# Patient Record
Sex: Male | Born: 1980
Health system: Southern US, Community
[De-identification: ages and names within clinical notes are randomized; demographics above are authoritative.]

## PROBLEM LIST (undated history)

## (undated) DIAGNOSIS — M549 Dorsalgia, unspecified: Secondary | ICD-10-CM

## (undated) DIAGNOSIS — I10 Essential (primary) hypertension: Secondary | ICD-10-CM

## (undated) DIAGNOSIS — N200 Calculus of kidney: Secondary | ICD-10-CM

## (undated) DIAGNOSIS — G8929 Other chronic pain: Secondary | ICD-10-CM

## (undated) HISTORY — PX: OTHER SURGICAL HISTORY: SHX169

## (undated) HISTORY — PX: NO PAST SURGERIES: SHX2092

## (undated) HISTORY — DX: Essential (primary) hypertension: I10

---

## 2005-01-07 ENCOUNTER — Emergency Department (HOSPITAL_COMMUNITY): Admission: EM | Admit: 2005-01-07 | Discharge: 2005-01-07 | Payer: Self-pay | Admitting: Emergency Medicine

## 2009-05-26 ENCOUNTER — Emergency Department: Payer: Self-pay | Admitting: Emergency Medicine

## 2010-07-25 DIAGNOSIS — K219 Gastro-esophageal reflux disease without esophagitis: Secondary | ICD-10-CM | POA: Insufficient documentation

## 2010-12-11 ENCOUNTER — Ambulatory Visit: Payer: Self-pay | Admitting: Family Medicine

## 2011-04-10 ENCOUNTER — Encounter: Payer: Self-pay | Admitting: *Deleted

## 2011-04-10 ENCOUNTER — Encounter: Payer: Self-pay | Admitting: Family Medicine

## 2011-04-10 ENCOUNTER — Ambulatory Visit (INDEPENDENT_AMBULATORY_CARE_PROVIDER_SITE_OTHER): Payer: 59 | Admitting: Family Medicine

## 2011-04-10 DIAGNOSIS — R5381 Other malaise: Secondary | ICD-10-CM

## 2011-04-10 DIAGNOSIS — E1169 Type 2 diabetes mellitus with other specified complication: Secondary | ICD-10-CM | POA: Insufficient documentation

## 2011-04-10 DIAGNOSIS — I10 Essential (primary) hypertension: Secondary | ICD-10-CM | POA: Insufficient documentation

## 2011-04-10 DIAGNOSIS — Z136 Encounter for screening for cardiovascular disorders: Secondary | ICD-10-CM

## 2011-04-10 DIAGNOSIS — E119 Type 2 diabetes mellitus without complications: Secondary | ICD-10-CM | POA: Insufficient documentation

## 2011-04-10 DIAGNOSIS — E785 Hyperlipidemia, unspecified: Secondary | ICD-10-CM | POA: Insufficient documentation

## 2011-04-10 DIAGNOSIS — R5383 Other fatigue: Secondary | ICD-10-CM | POA: Insufficient documentation

## 2011-04-10 DIAGNOSIS — K130 Diseases of lips: Secondary | ICD-10-CM

## 2011-04-10 LAB — LIPID PANEL
Cholesterol: 258 mg/dL — ABNORMAL HIGH (ref 0–200)
Total CHOL/HDL Ratio: 5

## 2011-04-10 LAB — BASIC METABOLIC PANEL
BUN: 11 mg/dL (ref 6–23)
Creatinine, Ser: 0.7 mg/dL (ref 0.4–1.5)
GFR: 162.33 mL/min (ref >60.00)

## 2011-04-10 MED ORDER — LOSARTAN POTASSIUM-HCTZ 100-25 MG PO TABS: 1.0000 | ORAL_TABLET | Freq: Every day | ORAL | Status: DC

## 2011-04-10 MED ORDER — METFORMIN HCL 1000 MG PO TABS: 1000.0000 mg | ORAL_TABLET | Freq: Two times a day (BID) | ORAL | Status: DC

## 2011-04-10 MED ORDER — GEMFIBROZIL 600 MG PO TABS: 600.0000 mg | ORAL_TABLET | Freq: Two times a day (BID) | ORAL | Status: DC

## 2011-04-10 MED ORDER — METOPROLOL TARTRATE 50 MG PO TABS: 50.0000 mg | ORAL_TABLET | Freq: Two times a day (BID) | ORAL | Status: DC

## 2011-04-10 MED ORDER — INSULIN GLARGINE 100 UNIT/ML ~~LOC~~ SOLN: 75.0000 [IU] | Freq: Every day | SUBCUTANEOUS | Status: DC

## 2011-04-10 MED ORDER — CLOTRIMAZOLE 1 % EX CREA: TOPICAL_CREAM | Freq: Two times a day (BID) | CUTANEOUS | Status: DC

## 2011-04-10 MED ORDER — AMLODIPINE BESYLATE 10 MG PO TABS: 10.0000 mg | ORAL_TABLET | Freq: Every day | ORAL | Status: DC

## 2011-04-10 NOTE — Progress Notes (Signed)
Subjective:    Patient ID: Travis Scott, male    DOB: [birth year], 30 y.o.   MRN: [id number]  HPI  30 yo here to establish care.  DM- diagnosed in 2004. Currently taking Metformin 1000 mg twice daily and Lantus 75 units daily. Checks his CBGs three times weekly, usually ranging 110-170s. Denies any episodes of hypoglycemia. Cannot remember last time a1c was checked. Had dilated eye exam earlier this year.  HTN- diagnosed as a teenager. On Metoprolol 50 mg twice daily, losartan/hctz 100/25 mg daily, and amlodipine 10 mg daily. No CP, SOB or blurred vision.  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  Less energy to play with his two boys when he gets home. No blood in stool, SOB or CP.  Patient Active Problem List  Diagnoses  . Hypertension  . Diabetes mellitus  . Angular cheilitis  . Fatigue  . Hyperlipidemia   Past Medical History  Diagnosis Date  . Hypertension   . Diabetes mellitus    No past surgical history on file. History  Substance Use Topics  . Smoking status: Current Some Day Smoker  . Smokeless tobacco: Not on file  . Alcohol Use: Not on file   Family History  Problem Relation Age of Onset  . Hypertension Mother   . Diabetes Mother   . Diabetes Father   . Hypertension Father    No Known Allergies  Current outpatient prescriptions:amLODipine (NORVASC) 10 MG tablet, Take 1 tablet (10 mg total) by mouth daily., Disp: 30 tablet, Rfl: 11;  gemfibrozil (LOPID) 600 MG tablet, Take 1 tablet (600 mg total) by mouth 2 (two) times daily., Disp: 60 tablet, Rfl: 11;  insulin glargine (LANTUS) 100 UNIT/ML injection, Inject 75 Units into the skin at bedtime., Disp: 10 mL, Rfl: 11 losartan-hydrochlorothiazide (HYZAAR) 100-25 MG per tablet, Take 1 tablet by mouth daily., Disp: 30 tablet, Rfl: 11;  metFORMIN (GLUCOPHAGE) 1000 MG tablet, Take 1 tablet (1,000 mg total) by mouth 2 (two) times  daily., Disp: 60 tablet, Rfl: 11;  metoprolol (LOPRESSOR) 50 MG tablet, Take 1 tablet (50 mg total) by mouth 2 (two) times daily., Disp: 60 tablet, Rfl: 11 clotrimazole (LOTRIMIN) 1 % cream, Apply topically 2 (two) times daily., Disp: 30 g, Rfl: 0  The PMH, PSH, Social History, Family History, Medications, and allergies have been reviewed in [facility], and have been updated if relevant.   Review of Systems See HPI    Objective:   Physical Exam BP 130/90  Pulse 96  Temp(Src) 98.7 F (37.1 C) (Oral)  Ht 6\' 1"  (1.854 m)  Wt 262 lb 4 oz (118.956 kg)  BMI 34.60 kg/m2 General:  overweght male in NAD Eyes:  PERRL Ears:  External ear exam shows no significant lesions or deformities.  Otoscopic examination reveals clear canals, tympanic membranes are intact bilaterally without bulging, retraction, inflammation or discharge. Hearing is grossly normal bilaterally. Nose:  External nasal examination shows no deformity or inflammation. Nasal mucosa are pink and moist without lesions or exudates. Mouth:  +angular chielitis,  oropharynx without lesions or exudates.  Teeth in good repair. Neck:  no carotid bruit or thyromegaly no cervical or supraclavicular lymphadenopathy  Lungs:  Normal respiratory effort, chest expands symmetrically. Lungs are clear to auscultation, no crackles or wheezes. Heart:  Normal rate and regular rhythm. S1 and S2 normal without gallop, murmur, click, rub or other extra sounds. Abdomen:  Bowel sounds positive,abdomen  soft and non-tender without masses, organomegaly or hernias noted. Pulses:  R and L posterior tibial pulses are full and equal bilaterally  Extremities:  no edema      Assessment & Plan:   1. Angular cheilitis   New.  Handout given, rx sent for lotrimin cream.   2. Diabetes mellitus   Unchanged.  Refilled meds, recheck a1c, BMET today. HgB A1c  3. Hypertension   Stable on current meds. Basic Metabolic Panel (BMET)  4. Fatigue   New,  likey due to changing  shifts at work but will check labs to rule out other possible contributing factors. Vitamin D (25 hydroxy), B12  5. Hyperlipidemia   Recheck lipid panel today.

## 2011-04-10 NOTE — Patient Instructions (Signed)
Good to see you.  Try using antifungal cream.   We will be in touch with your lab work.

## 2011-04-11 ENCOUNTER — Other Ambulatory Visit: Payer: Self-pay | Admitting: Family Medicine

## 2011-04-11 ENCOUNTER — Emergency Department: Payer: Self-pay | Admitting: Emergency Medicine

## 2011-04-11 LAB — LDL CHOLESTEROL, DIRECT: Direct LDL: 191.3 mg/dL

## 2011-04-11 MED ORDER — ROSUVASTATIN CALCIUM 10 MG PO TABS: 10.0000 mg | ORAL_TABLET | Freq: Every day | ORAL | Status: DC

## 2011-04-11 MED ORDER — ERGOCALCIFEROL 1.25 MG (50000 UT) PO CAPS: 50000.0000 [IU] | ORAL_CAPSULE | ORAL | Status: DC

## 2011-04-11 MED ORDER — GLYBURIDE 5 MG PO TABS: 5.0000 mg | ORAL_TABLET | Freq: Every day | ORAL | Status: DC

## 2011-04-11 MED ORDER — INSULIN GLARGINE 100 UNIT/ML ~~LOC~~ SOLN: 80.0000 [IU] | Freq: Every day | SUBCUTANEOUS | Status: DC

## 2011-04-13 ENCOUNTER — Encounter: Payer: Self-pay | Admitting: *Deleted

## 2011-05-28 ENCOUNTER — Emergency Department: Payer: Self-pay | Admitting: Emergency Medicine

## 2011-07-22 ENCOUNTER — Other Ambulatory Visit: Payer: Self-pay | Admitting: Family Medicine

## 2011-11-23 ENCOUNTER — Ambulatory Visit: Payer: Self-pay | Admitting: Emergency Medicine

## 2011-11-24 ENCOUNTER — Ambulatory Visit: Payer: Self-pay

## 2011-11-25 ENCOUNTER — Ambulatory Visit: Payer: Self-pay | Admitting: Internal Medicine

## 2011-11-26 ENCOUNTER — Ambulatory Visit: Payer: Self-pay | Admitting: Emergency Medicine

## 2011-11-27 LAB — WOUND CULTURE

## 2011-11-28 ENCOUNTER — Ambulatory Visit: Payer: Self-pay | Admitting: Internal Medicine

## 2011-12-03 ENCOUNTER — Ambulatory Visit: Payer: Self-pay

## 2011-12-06 ENCOUNTER — Ambulatory Visit (INDEPENDENT_AMBULATORY_CARE_PROVIDER_SITE_OTHER): Payer: BC Managed Care – PPO | Admitting: Family Medicine

## 2011-12-06 ENCOUNTER — Encounter: Payer: Self-pay | Admitting: Family Medicine

## 2011-12-06 ENCOUNTER — Other Ambulatory Visit: Payer: Self-pay | Admitting: Family Medicine

## 2011-12-06 VITALS — BP 140/80 | HR 76 | Temp 98.0°F | Ht 72.0 in | Wt 260.0 lb

## 2011-12-06 DIAGNOSIS — E119 Type 2 diabetes mellitus without complications: Secondary | ICD-10-CM | POA: Insufficient documentation

## 2011-12-06 DIAGNOSIS — E785 Hyperlipidemia, unspecified: Secondary | ICD-10-CM

## 2011-12-06 DIAGNOSIS — R5381 Other malaise: Secondary | ICD-10-CM

## 2011-12-06 DIAGNOSIS — E1149 Type 2 diabetes mellitus with other diabetic neurological complication: Secondary | ICD-10-CM | POA: Insufficient documentation

## 2011-12-06 DIAGNOSIS — E1165 Type 2 diabetes mellitus with hyperglycemia: Secondary | ICD-10-CM

## 2011-12-06 DIAGNOSIS — I10 Essential (primary) hypertension: Secondary | ICD-10-CM

## 2011-12-06 DIAGNOSIS — R5383 Other fatigue: Secondary | ICD-10-CM

## 2011-12-06 LAB — COMPREHENSIVE METABOLIC PANEL
ALT: 15 U/L (ref 0–53)
AST: 16 U/L (ref 0–37)
Calcium: 9.6 mg/dL (ref 8.4–10.5)
Chloride: 97 mEq/L (ref 96–112)
Creatinine, Ser: 0.7 mg/dL (ref 0.4–1.5)
Sodium: 132 mEq/L — ABNORMAL LOW (ref 135–145)

## 2011-12-06 LAB — LIPID PANEL
HDL: 39.6 mg/dL (ref >39.00)
Total CHOL/HDL Ratio: 5
VLDL: 19.4 mg/dL (ref 0.0–40.0)

## 2011-12-06 LAB — HEMOGLOBIN A1C: Hgb A1c MFr Bld: 15.5 % — ABNORMAL HIGH (ref 4.6–6.5)

## 2011-12-06 MED ORDER — ROSUVASTATIN CALCIUM 10 MG PO TABS: 10.0000 mg | ORAL_TABLET | Freq: Every day | ORAL | Status: AC

## 2011-12-06 NOTE — Patient Instructions (Signed)
Great to see you. We will call you with your lab results. I will hold onto your form until we get your labs back.

## 2011-12-06 NOTE — Progress Notes (Signed)
Subjective:    Patient ID: Travis Scott, male    DOB: 10/17/1980, 31 y.o.   MRN: [id number]  HPI  31 yo here to have forms filled out for work.  Established care here in 03/2011.   DM- diagnosed in 2004. a1c was extremely elevated and was advised to increase lantus and follow up in 3 months. Lab Results  Component Value Date   HGBA1C 14.8* 04/10/2011  Pt did not follow up and continued his previous dose of medication because he had not been taking it for weeks prior to labs being checked.  Currently taking Metformin 1000 mg twice daily and glyburide 5 mg daily. Has been out of lantus for one month due to cost. Checks his CBGs three times weekly, usually ranging 170s-200s.. Denies any episodes of hypoglycemia.  HLD- cholesterol very elevated as well.  I sent in rx for crestor but pt refused to take it- preferred to try diet only. Lab Results  Component Value Date   CHOL 258* 04/10/2011   HDL 55.90 04/10/2011   LDLDIRECT 191.3 04/10/2011   TRIG 122.0 04/10/2011   CHOLHDL 5 04/10/2011    HTN- diagnosed as a teenager. On Metoprolol 50 mg twice daily, losartan/hctz 100/25 mg daily, and amlodipine 10 mg daily. No CP, SOB or blurred vision. Lab Results  Component Value Date   CREATININE 0.7 04/10/2011   Low Vit D- Diagnosed in 03/2011, Vit D 23- s/p high dose replacement.   Patient Active Problem List  Diagnoses  . Hypertension  . Diabetes mellitus  . Angular cheilitis  . Fatigue  . Hyperlipidemia   Past Medical History  Diagnosis Date  . Hypertension   . Diabetes mellitus    No past surgical history on file. History  Substance Use Topics  . Smoking status: Current Some Day Smoker  . Smokeless tobacco: Not on file  . Alcohol Use: Not on file   Family History  Problem Relation Age of Onset  . Hypertension Mother   . Diabetes Mother   . Diabetes Father   . Hypertension Father    No Known Allergies  Current outpatient prescriptions:amLODipine (NORVASC) 10 MG  tablet, Take 1 tablet (10 mg total) by mouth daily., Disp: 30 tablet, Rfl: 11;  clotrimazole (LOTRIMIN) 1 % cream, Apply topically 2 (two) times daily., Disp: 30 g, Rfl: 0;  ergocalciferol (VITAMIN D2) 50000 UNITS capsule, Take 1 capsule (50,000 Units total) by mouth once a week., Disp: 4 capsule, Rfl: 12 gemfibrozil (LOPID) 600 MG tablet, Take 1 tablet (600 mg total) by mouth 2 (two) times daily., Disp: 60 tablet, Rfl: 11;  glyBURIDE (DIABETA) 5 MG tablet, TAKE 1 TABLET BY MOUTH EVERY DAY WITH BREAKFAST, Disp: 30 tablet, Rfl: 12;  insulin glargine (LANTUS) 100 UNIT/ML injection, Inject 80 Units into the skin at bedtime., Disp: 10 mL, Rfl: 11 losartan-hydrochlorothiazide (HYZAAR) 100-25 MG per tablet, Take 1 tablet by mouth daily., Disp: 30 tablet, Rfl: 11;  metFORMIN (GLUCOPHAGE) 1000 MG tablet, Take 1 tablet (1,000 mg total) by mouth 2 (two) times daily., Disp: 60 tablet, Rfl: 11;  metoprolol (LOPRESSOR) 50 MG tablet, Take 1 tablet (50 mg total) by mouth 2 (two) times daily., Disp: 60 tablet, Rfl: 11 rosuvastatin (CRESTOR) 10 MG tablet, Take 1 tablet (10 mg total) by mouth at bedtime., Disp: 30 tablet, Rfl: 11  The PMH, PSH, Social History, Family History, Medications, and allergies have been reviewed in [facility], and have been updated if relevant.   Review of Systems See HPI  Objective:   Physical Exam BP 140/80  Pulse 76  Temp 98 F (36.7 C)  Ht 6' (1.829 m)  Wt 260 lb (117.935 kg)  BMI 35.26 kg/m2 General:  overweght male in NAD Eyes:  PERRL Ears:  External ear exam shows no significant lesions or deformities.  Otoscopic examination reveals clear canals, tympanic membranes are intact bilaterally without bulging, retraction, inflammation or discharge. Hearing is grossly normal bilaterally. Nose:  External nasal examination shows no deformity or inflammation. Nasal mucosa are pink and moist without lesions or exudates. Neck:  no carotid bruit or thyromegaly no cervical or supraclavicular  lymphadenopathy  Lungs:  Normal respiratory effort, chest expands symmetrically. Lungs are clear to auscultation, no crackles or wheezes. Heart:  Normal rate and regular rhythm. S1 and S2 normal without gallop, murmur, click, rub or other extra sounds. Abdomen:  Bowel sounds positive,abdomen soft and non-tender without masses, organomegaly or hernias noted. Pulses:  R and L posterior tibial pulses are full and equal bilaterally  Extremities:  no edema      Assessment & Plan:   1. Work forms  Will return to patient once we get lab results from today.   2. Diabetes mellitus   Remains poorly controlled.  We do not have lantus samples and he did not want to switch to short acting.  Byetta would be expensive as well. Pt has a high deductible.  Refused endocrinology referral.  Advised picking up one vial and as soon as we get samples, I will call him. HgB A1c  3. Hypertension   Stable on current meds. Basic Metabolic Panel (BMET)  4. Hyperlipidemia   Recheck lipid panel today. He is willing to start crestor now, given samples.

## 2011-12-07 ENCOUNTER — Ambulatory Visit: Payer: 59 | Admitting: Family Medicine

## 2012-01-23 ENCOUNTER — Encounter: Payer: Self-pay | Admitting: *Deleted

## 2012-03-26 ENCOUNTER — Encounter: Payer: Self-pay | Admitting: Family Medicine

## 2012-03-26 ENCOUNTER — Ambulatory Visit (INDEPENDENT_AMBULATORY_CARE_PROVIDER_SITE_OTHER): Payer: BC Managed Care – PPO | Admitting: Family Medicine

## 2012-03-26 VITALS — BP 132/80 | HR 72 | Temp 98.1°F | Wt 255.0 lb

## 2012-03-26 DIAGNOSIS — E119 Type 2 diabetes mellitus without complications: Secondary | ICD-10-CM

## 2012-03-26 DIAGNOSIS — I10 Essential (primary) hypertension: Secondary | ICD-10-CM

## 2012-03-26 DIAGNOSIS — M549 Dorsalgia, unspecified: Secondary | ICD-10-CM

## 2012-03-26 MED ORDER — TRAMADOL HCL 50 MG PO TABS: 50.0000 mg | ORAL_TABLET | Freq: Three times a day (TID) | ORAL | Status: AC | PRN

## 2012-03-26 MED ORDER — DIAZEPAM 5 MG PO TABS: ORAL_TABLET | ORAL | Status: DC

## 2012-03-26 NOTE — Progress Notes (Signed)
Subjective:    Patient ID: Travis Scott, male    DOB: 1980-12-08, 31 y.o.   MRN: [id number]  HPI  31 yo here for follow up and back pain.  Back pain- lifted something at work and thinks he reactivated an old injury that happened while he was picking up his son last year.  Bilateral low back pain worsened with bending and walking. No LE weakness or radiculopathy.   DM- diagnosed in 2004.  Referred him to endocrinology- appt later this month.  Lab Results  Component Value Date   HGBA1C 15.5* 12/06/2011   Currently taking Metformin 1000 mg twice daily and glyburide 5 mg daily. Just restarted his lantus.  Checks his CBGs three times weekly, usually ranging 170s-200s.. Denies any episodes of hypoglycemia.    HTN- diagnosed as a teenager. On Metoprolol 50 mg twice daily, losartan/hctz 100/25 mg daily, and amlodipine 10 mg daily. No CP, SOB or blurred vision. Lab Results  Component Value Date   CREATININE 0.7 12/06/2011      Patient Active Problem List  Diagnosis  . Hypertension  . Diabetes mellitus  . Angular cheilitis  . Fatigue  . Hyperlipidemia  . Diabetes mellitus out of control   Past Medical History  Diagnosis Date  . Hypertension   . Diabetes mellitus    No past surgical history on file. History  Substance Use Topics  . Smoking status: Current Some Day Smoker  . Smokeless tobacco: Not on file  . Alcohol Use: Not on file   Family History  Problem Relation Age of Onset  . Hypertension Mother   . Diabetes Mother   . Diabetes Father   . Hypertension Father    No Known Allergies  Current outpatient prescriptions:amLODipine (NORVASC) 10 MG tablet, Take 1 tablet (10 mg total) by mouth daily., Disp: 30 tablet, Rfl: 11;  ergocalciferol (VITAMIN D2) 50000 UNITS capsule, Take 1 capsule (50,000 Units total) by mouth once a week., Disp: 4 capsule, Rfl: 12;  gemfibrozil (LOPID) 600 MG tablet, Take 1 tablet (600 mg total) by mouth 2 (two) times daily., Disp: 60  tablet, Rfl: 11 glyBURIDE (DIABETA) 5 MG tablet, TAKE 1 TABLET BY MOUTH EVERY DAY WITH BREAKFAST, Disp: 30 tablet, Rfl: 12;  insulin glargine (LANTUS) 100 UNIT/ML injection, Inject 80 Units into the skin at bedtime., Disp: 10 mL, Rfl: 11;  losartan-hydrochlorothiazide (HYZAAR) 100-25 MG per tablet, Take 1 tablet by mouth daily., Disp: 30 tablet, Rfl: 11 metFORMIN (GLUCOPHAGE) 1000 MG tablet, Take 1 tablet (1,000 mg total) by mouth 2 (two) times daily., Disp: 60 tablet, Rfl: 11;  metoprolol (LOPRESSOR) 50 MG tablet, Take 1 tablet (50 mg total) by mouth 2 (two) times daily., Disp: 60 tablet, Rfl: 11;  rosuvastatin (CRESTOR) 10 MG tablet, Take 1 tablet (10 mg total) by mouth at bedtime., Disp: 30 tablet, Rfl: 11  The PMH, PSH, Social History, Family History, Medications, and allergies have been reviewed in [facility], and have been updated if relevant.   Review of Systems See HPI    Objective:   Physical Exam BP 132/80  Pulse 72  Temp 98.1 F (36.7 C)  Wt 255 lb (115.667 kg) General:  overweght male in NAD Eyes:  PERRL Ears:  External ear exam shows no significant lesions or deformities.  Otoscopic examination reveals clear canals, tympanic membranes are intact bilaterally without bulging, retraction, inflammation or discharge. Hearing is grossly normal bilaterally. Nose:  External nasal examination shows no deformity or inflammation. Nasal mucosa are pink and moist without lesions  or exudates. Neck:  no carotid bruit or thyromegaly no cervical or supraclavicular lymphadenopathy  Lungs:  Normal respiratory effort, chest expands symmetrically. Lungs are clear to auscultation, no crackles or wheezes. Heart:  Normal rate and regular rhythm. S1 and S2 normal without gallop, murmur, click, rub or other extra sounds. Abdomen:  Bowel sounds positive,abdomen soft and non-tender without masses, organomegaly or hernias noted. Pulses:  R and L posterior tibial pulses are full and equal bilaterally  Extremities:   no edema  MSK: Lumbosacral spine area reveals no local tenderness or mass. Painful and reduced LS ROM noted. Straight leg raise is negative bilaterally. DTR's, motor strength and sensation normal, including heel and toe gait.  Peripheral pulses are palpable. Lumbar spine X-Ray: not indicated.          Assessment & Plan:   1. Low back pain  Lumbar strain. Valium, tramadol as needed (see pt instructions). Given handout from sports med advisor for exercises. Consider imaging and PT if symptoms persist.   2. Diabetes mellitus   Remains poorly controlled.   Given lantus samples today. Keep appt with endo.   3. Hypertension   Stable on current meds. Basic Metabolic Panel (BMET)

## 2012-03-26 NOTE — Patient Instructions (Signed)
You have lumbar radiculopathy (a pinched nerve in your low back). Take tylenol for baseline pain relief (1-2 extra strength tabs 3x/day) Tramadol as needed for severe pain.   Valium as needed for muscle spasms (no driving on this medicine). Stay as active as possible. Physical therapy has been shown to be helpful as well. Strengthening of low back muscles, abdominal musculature are key for long term pain relief. If not improving, will consider imaging.

## 2012-04-19 ENCOUNTER — Other Ambulatory Visit: Payer: Self-pay | Admitting: Family Medicine

## 2012-04-21 ENCOUNTER — Other Ambulatory Visit: Payer: Self-pay | Admitting: *Deleted

## 2012-04-21 MED ORDER — LOSARTAN POTASSIUM-HCTZ 100-25 MG PO TABS: 1.0000 | ORAL_TABLET | Freq: Every day | ORAL | Status: DC

## 2012-04-21 NOTE — Telephone Encounter (Signed)
Refill request for  Vitamin D, is patient to continue this?

## 2012-04-21 NOTE — Telephone Encounter (Signed)
Yes please continue but do need to recheck his Vit D at his convenience.

## 2012-04-22 ENCOUNTER — Other Ambulatory Visit: Payer: Self-pay | Admitting: *Deleted

## 2012-04-22 MED ORDER — INSULIN GLARGINE 100 UNIT/ML ~~LOC~~ SOLN: 80.0000 [IU] | Freq: Every day | SUBCUTANEOUS | Status: DC

## 2012-04-22 NOTE — Telephone Encounter (Signed)
Pt's phone number is no longer in service.  Mailed letter to his home address asking that he call us back.

## 2012-04-25 ENCOUNTER — Ambulatory Visit (INDEPENDENT_AMBULATORY_CARE_PROVIDER_SITE_OTHER): Payer: BC Managed Care – PPO | Admitting: Family Medicine

## 2012-04-25 ENCOUNTER — Encounter: Payer: Self-pay | Admitting: Family Medicine

## 2012-04-25 VITALS — BP 120/72 | HR 80 | Temp 98.0°F | Wt 265.0 lb

## 2012-04-25 DIAGNOSIS — R1032 Left lower quadrant pain: Secondary | ICD-10-CM

## 2012-04-25 LAB — POCT URINALYSIS DIPSTICK
Ketones, UA: NEGATIVE
Leukocytes, UA: NEGATIVE
Nitrite, UA: NEGATIVE
Protein, UA: NEGATIVE

## 2012-04-25 NOTE — Patient Instructions (Addendum)
Good to see you. Please stop by to see Travis Scott on your way out to set up your ultrasound. We will call you with those results.    

## 2012-04-25 NOTE — Progress Notes (Signed)
Subjective:    Patient ID: Travis Scott, male    DOB: May 31, 1981, 31 y.o.   MRN: [id number]  HPI  31 yo with h/o poorly controlled diabetes, non compliance here to have forms filled out and for groin pain.  We referred him to endocrinology and per pt, his CBGs have been improving.  Was 81 fasting this morning.   Last two days, has had left groin pain.  Does have a h/o kidney stones but this pain feels different.  No dysuria, hematuria or flank pain. Did not lift anything unusually heavy.  Pain is not worsened by coughing or having a BM. Points to lateral to left scrotum.    Patient Active Problem List  Diagnosis  . Hypertension  . Diabetes mellitus  . Angular cheilitis  . Fatigue  . Hyperlipidemia  . Diabetes mellitus out of control  . Back pain  . Left groin pain   Past Medical History  Diagnosis Date  . Hypertension   . Diabetes mellitus    No past surgical history on file. History  Substance Use Topics  . Smoking status: Current Some Day Smoker  . Smokeless tobacco: Not on file  . Alcohol Use: Not on file   Family History  Problem Relation Age of Onset  . Hypertension Mother   . Diabetes Mother   . Diabetes Father   . Hypertension Father    No Known Allergies  Current outpatient prescriptions:amLODipine (NORVASC) 10 MG tablet, Take 1 tablet (10 mg total) by mouth daily., Disp: 30 tablet, Rfl: 11;  diazepam (VALIUM) 5 MG tablet, 1- 2 tabs by mouth as needed for muscle spasm; then may repeat 1-2 tabs by mouth in 3-4 hours, if necessary., Disp: 30 tablet, Rfl: 1;  gemfibrozil (LOPID) 600 MG tablet, Take 1 tablet (600 mg total) by mouth 2 (two) times daily., Disp: 60 tablet, Rfl: 11 glyBURIDE (DIABETA) 5 MG tablet, TAKE 1 TABLET BY MOUTH EVERY DAY WITH BREAKFAST, Disp: 30 tablet, Rfl: 12;  insulin glargine (LANTUS) 100 UNIT/ML injection, Inject 80 Units into the skin at bedtime., Disp: 10 mL, Rfl: 11;  losartan-hydrochlorothiazide (HYZAAR) 100-25 MG per tablet,  Take 1 tablet by mouth daily., Disp: 30 tablet, Rfl: 6;  metFORMIN (GLUCOPHAGE) 1000 MG tablet, TAKE 1 TABLET BY MOUTH TWICE DAILY, Disp: 60 tablet, Rfl: 6 metoprolol (LOPRESSOR) 50 MG tablet, Take 1 tablet (50 mg total) by mouth 2 (two) times daily., Disp: 60 tablet, Rfl: 11;  rosuvastatin (CRESTOR) 10 MG tablet, Take 1 tablet (10 mg total) by mouth at bedtime., Disp: 30 tablet, Rfl: 11;  Vitamin D, Ergocalciferol, (DRISDOL) 50000 UNITS CAPS, TAKE 1 CAPSULE BY MOUTH EACH WEEK., Disp: 4 capsule, Rfl: 0  The PMH, PSH, Social History, Family History, Medications, and allergies have been reviewed in [facility], and have been updated if relevant.   Review of Systems See HPI    Objective:   Physical Exam BP 120/72  Pulse 80  Temp 98 F (36.7 C)  Wt 265 lb (120.203 kg)  General:  overweght male in NAD Eyes:  PERRL Ears:  External ear exam shows no significant lesions or deformities.  Otoscopic examination reveals clear canals, tympanic membranes are intact bilaterally without bulging, retraction, inflammation or discharge. Hearing is grossly normal bilaterally. Nose:  External nasal examination shows no deformity or inflammation. Nasal mucosa are pink and moist without lesions or exudates. Mouth:  Oral mucosa and oropharynx without lesions or exudates.  Teeth in good repair. Neck:  no carotid bruit or thyromegaly no  cervical or supraclavicular lymphadenopathy  Lungs:  Normal respiratory effort, chest expands symmetrically. Lungs are clear to auscultation, no crackles or wheezes. Heart:  Normal rate and regular rhythm. S1 and S2 normal without gallop, murmur, click, rub or other extra sounds. Abdomen:  Bowel sounds positive,abdomen soft and non-tender without masses, organomegaly or hernias noted. Genitalia:  Testes bilaterally descended without nodularity, tenderness or masses. No scrotal masses or lesions. No penis lesions or urethral discharge. Prostate:  Prostate gland firm and smooth, 1 plus  enlargement, no nodularity, tenderness, mass, asymmetry or induration. Pulses:  R and L posterior tibial pulses are full and equal bilaterally  Extremities:  no edema         Assessment & Plan:   1. Left groin pain  POCT urinalysis dipstick, US Scrotum   New- unable to palpate any hernia on exam. ?possible hernia that I cannot appreciate. UA neg- no RBCs, unlikely nephrolithiasis. Will refer for scrotal US. The patient indicates understanding of these issues and agrees with the plan.

## 2012-04-30 ENCOUNTER — Encounter: Payer: Self-pay | Admitting: Family Medicine

## 2012-04-30 ENCOUNTER — Ambulatory Visit: Payer: Self-pay | Admitting: Family Medicine

## 2012-05-01 ENCOUNTER — Other Ambulatory Visit: Payer: Self-pay | Admitting: Family Medicine

## 2012-05-01 DIAGNOSIS — R1030 Lower abdominal pain, unspecified: Secondary | ICD-10-CM

## 2012-05-01 DIAGNOSIS — N433 Hydrocele, unspecified: Secondary | ICD-10-CM

## 2012-05-20 ENCOUNTER — Other Ambulatory Visit: Payer: Self-pay | Admitting: Family Medicine

## 2012-07-07 ENCOUNTER — Other Ambulatory Visit: Payer: Self-pay | Admitting: *Deleted

## 2012-07-07 MED ORDER — AMLODIPINE BESYLATE 10 MG PO TABS: 10.0000 mg | ORAL_TABLET | Freq: Every day | ORAL | Status: DC

## 2012-07-17 ENCOUNTER — Ambulatory Visit (INDEPENDENT_AMBULATORY_CARE_PROVIDER_SITE_OTHER)
Admission: RE | Admit: 2012-07-17 | Discharge: 2012-07-17 | Disposition: A | Discharge status: Home / Self Care | Payer: BC Managed Care – PPO | Source: Ambulatory Visit | Attending: Family Medicine | Admitting: Family Medicine

## 2012-07-17 ENCOUNTER — Ambulatory Visit (INDEPENDENT_AMBULATORY_CARE_PROVIDER_SITE_OTHER): Payer: BC Managed Care – PPO | Admitting: Family Medicine

## 2012-07-17 ENCOUNTER — Encounter: Payer: Self-pay | Admitting: Family Medicine

## 2012-07-17 VITALS — BP 150/98 | HR 72 | Temp 99.1°F | Wt 273.0 lb

## 2012-07-17 DIAGNOSIS — M79605 Pain in left leg: Secondary | ICD-10-CM

## 2012-07-17 DIAGNOSIS — M545 Low back pain, unspecified: Secondary | ICD-10-CM

## 2012-07-17 MED ORDER — AMITRIPTYLINE HCL 25 MG PO TABS: 25.0000 mg | ORAL_TABLET | Freq: Every day | ORAL | Status: DC

## 2012-07-17 NOTE — Progress Notes (Signed)
Subjective:    Patient ID: Travis Scott, male    DOB: 1980/11/21, 32 y.o.   MRN: [id number]  HPI  32 yo with h/o poorly controlled diabetes here with persistent low back pain and groin pain.  We referred him to urology.  Notes reviewed- he does have low testosterone, but otherwise did not feel it was a urological issue.  Did see some lumbar disc disease on KUB.  Back pain in low back, can radiate to both legs and still having burning in bilateral inguinal area.  Flexeril did not help and he felt caused mood swings.  Just started androgel for low T.   Patient Active Problem List  Diagnosis  . Hypertension  . Diabetes mellitus  . Angular cheilitis  . Fatigue  . Hyperlipidemia  . Diabetes mellitus out of control  . Back pain  . Left groin pain   Past Medical History  Diagnosis Date  . Hypertension   . Diabetes mellitus    No past surgical history on file. History  Substance Use Topics  . Smoking status: Current Some Day Smoker  . Smokeless tobacco: Not on file  . Alcohol Use: Not on file   Family History  Problem Relation Age of Onset  . Hypertension Mother   . Diabetes Mother   . Diabetes Father   . Hypertension Father    No Known Allergies  Current outpatient prescriptions:amLODipine (NORVASC) 10 MG tablet, Take 1 tablet (10 mg total) by mouth daily., Disp: 30 tablet, Rfl: 5;  diazepam (VALIUM) 5 MG tablet, 1- 2 tabs by mouth as needed for muscle spasm; then may repeat 1-2 tabs by mouth in 3-4 hours, if necessary., Disp: 30 tablet, Rfl: 1;  gemfibrozil (LOPID) 600 MG tablet, Take 1 tablet (600 mg total) by mouth 2 (two) times daily., Disp: 60 tablet, Rfl: 11 glyBURIDE (DIABETA) 5 MG tablet, TAKE 1 TABLET BY MOUTH EVERY DAY WITH BREAKFAST, Disp: 30 tablet, Rfl: 12;  insulin glargine (LANTUS) 100 UNIT/ML injection, Inject 80 Units into the skin at bedtime., Disp: 10 mL, Rfl: 11;  losartan-hydrochlorothiazide (HYZAAR) 100-25 MG per tablet, Take 1 tablet by mouth  daily., Disp: 30 tablet, Rfl: 6;  metFORMIN (GLUCOPHAGE) 1000 MG tablet, TAKE 1 TABLET BY MOUTH TWICE DAILY, Disp: 60 tablet, Rfl: 6 metoprolol (LOPRESSOR) 50 MG tablet, TAKE 1 TABLET BY MOUTH TWICE DAILY, Disp: 60 tablet, Rfl: 5;  rosuvastatin (CRESTOR) 10 MG tablet, Take 1 tablet (10 mg total) by mouth at bedtime., Disp: 30 tablet, Rfl: 11;  Vitamin D, Ergocalciferol, (DRISDOL) 50000 UNITS CAPS, TAKE 1 CAPSULE BY MOUTH EACH WEEK., Disp: 4 capsule, Rfl: 0;  amitriptyline (ELAVIL) 25 MG tablet, Take 1 tablet (25 mg total) by mouth at bedtime., Disp: 30 tablet, Rfl: 0  The PMH, PSH, Social History, Family History, Medications, and allergies have been reviewed in [facility], and have been updated if relevant.   Review of Systems See HPI    Still having impotence Objective:   Physical Exam BP 150/98  Pulse 72  Temp 99.1 F (37.3 C)  Wt 273 lb (123.832 kg)  General:  overweght male in NAD Eyes:  PERRL Ears:  External ear exam shows no significant lesions or deformities.  Otoscopic examination reveals clear canals, tympanic membranes are intact bilaterally without bulging, retraction, inflammation or discharge. Hearing is grossly normal bilaterally. Nose:  External nasal examination shows no deformity or inflammation. Nasal mucosa are pink and moist without lesions or exudates. Mouth:  Oral mucosa and oropharynx without lesions or exudates.  Teeth in good repair. Neck:  no carotid bruit or thyromegaly no cervical or supraclavicular lymphadenopathy  Pulses:  R and L posterior tibial pulses are full and equal bilaterally  Extremities:  no edema  No tenderness over lumbar spine.  SLR mildly positive bilaterally. Neg faber     Assessment & Plan:   1. Low back pain radiating to both legs  DG Lumbar Spine Complete, MR Lumbar Spine Wo Contrast   Persistent- ? If inguinal neuropathy is coming from his lumbar spine.  Will start Elavil 25 mg qhs.  Order lumbar xray and proceed with lumbar MRI for further  evaluation. The patient indicates understanding of these issues and agrees with the plan.

## 2012-07-17 NOTE — Patient Instructions (Addendum)
Good to see you. We will call you xray results.  Stop by to see Shirlee Limerick on your way out, she will set up your MRI.

## 2012-07-22 ENCOUNTER — Ambulatory Visit
Admission: RE | Admit: 2012-07-22 | Discharge: 2012-07-22 | Disposition: A | Discharge status: Home / Self Care | Payer: BC Managed Care – PPO | Source: Ambulatory Visit | Attending: Family Medicine | Admitting: Family Medicine

## 2012-07-22 DIAGNOSIS — M545 Low back pain: Secondary | ICD-10-CM

## 2012-07-22 DIAGNOSIS — M79605 Pain in left leg: Secondary | ICD-10-CM

## 2012-07-23 ENCOUNTER — Ambulatory Visit (INDEPENDENT_AMBULATORY_CARE_PROVIDER_SITE_OTHER): Payer: BC Managed Care – PPO | Admitting: Family Medicine

## 2012-07-23 ENCOUNTER — Encounter: Payer: Self-pay | Admitting: Family Medicine

## 2012-07-23 VITALS — BP 148/92 | Temp 98.1°F | Wt 271.0 lb

## 2012-07-23 DIAGNOSIS — M549 Dorsalgia, unspecified: Secondary | ICD-10-CM

## 2012-07-23 NOTE — Progress Notes (Signed)
Subjective:    Patient ID: Travis Scott, male    DOB: 1980-11-07, 32 y.o.   MRN: [id number]  HPI  32 yo with h/o poorly controlled diabetes here with persistent low back pain and groin pain here to discuss MRI results.  We referred him to urology.  Notes reviewed- he does have low testosterone, but otherwise did not feel it was a urological issue.  Did see some lumbar disc disease on KUB so we proceeded with  Lumbar xray and MRI.  Dg Lumbar Spine Complete  07/17/2012  *RADIOLOGY REPORT*  Clinical Data: Low back pain with bilateral inguinal pain.  LUMBAR SPINE - COMPLETE 4+ VIEW  Comparison: None.  Findings: Image quality is degraded by body habitus and technique. Alignment is anatomic.  Vertebral body and disc space height are maintained.  No definite pars defects.  No significant degenerative changes.  IMPRESSION: No acute findings.   Original Report Authenticated By: Leanna Battles, M.D.    Mr Lumbar Spine Wo Contrast  07/22/2012  *RADIOLOGY REPORT*  Clinical Data: Low back pain with bilateral leg pain.  MRI LUMBAR SPINE WITHOUT CONTRAST  Technique:  Multiplanar and multiecho pulse sequences of the lumbar spine were obtained without intravenous contrast.  Comparison: Lumbar radiographs 07/17/2012  Findings: Normal lumbar alignment.  Negative for fracture or mass lesion.  Conus medullaris is normal and terminates at L1.  L1-2:  Mild disc space narrowing and mild facet degeneration. Negative for disc protrusion or spinal stenosis.  L2-3:  Negative  L3-4:  Mild disc bulging and early facet degeneration without disc protrusion or stenosis. Mild endplate osteophyte formation.  L4-5:  Mild disc degeneration with mild spondylosis.  Mild facet hypertrophy.  No significant spinal stenosis or disc protrusion.  L5-S1:  Shallow chronic-appearing central disc protrusion with associated endplate osteophyte formation.  Discogenic edema in the endplates at L5 and S1.  No acute soft disc protrusion.  No compression  of the nerve roots.  IMPRESSION: Mild disc degeneration and mild spondylosis at L3-4 and L4-5 without significant spinal stenosis.  Chronic appearing central disc protrusion with associated spondylosis L5-S1.  No significant neural impingement.   Original Report Authenticated By: Janeece Riggers, M.D.    Patient Active Problem List  Diagnosis  . Hypertension  . Diabetes mellitus  . Angular cheilitis  . Fatigue  . Hyperlipidemia  . Diabetes mellitus out of control  . Back pain  . Left groin pain   Past Medical History  Diagnosis Date  . Hypertension   . Diabetes mellitus    No past surgical history on file. History  Substance Use Topics  . Smoking status: Current Some Day Smoker  . Smokeless tobacco: Not on file  . Alcohol Use: Not on file   Family History  Problem Relation Age of Onset  . Hypertension Mother   . Diabetes Mother   . Diabetes Father   . Hypertension Father    No Known Allergies  Current outpatient prescriptions:amitriptyline (ELAVIL) 25 MG tablet, Take 1 tablet (25 mg total) by mouth at bedtime., Disp: 30 tablet, Rfl: 0;  amLODipine (NORVASC) 10 MG tablet, Take 1 tablet (10 mg total) by mouth daily., Disp: 30 tablet, Rfl: 5;  diazepam (VALIUM) 5 MG tablet, 1- 2 tabs by mouth as needed for muscle spasm; then may repeat 1-2 tabs by mouth in 3-4 hours, if necessary., Disp: 30 tablet, Rfl: 1 gemfibrozil (LOPID) 600 MG tablet, Take 1 tablet (600 mg total) by mouth 2 (two) times daily., Disp: 60 tablet, Rfl:  11;  glyBURIDE (DIABETA) 5 MG tablet, TAKE 1 TABLET BY MOUTH EVERY DAY WITH BREAKFAST, Disp: 30 tablet, Rfl: 12;  insulin glargine (LANTUS) 100 UNIT/ML injection, Inject 80 Units into the skin at bedtime., Disp: 10 mL, Rfl: 11 losartan-hydrochlorothiazide (HYZAAR) 100-25 MG per tablet, Take 1 tablet by mouth daily., Disp: 30 tablet, Rfl: 6;  metFORMIN (GLUCOPHAGE) 1000 MG tablet, TAKE 1 TABLET BY MOUTH TWICE DAILY, Disp: 60 tablet, Rfl: 6;  metoprolol (LOPRESSOR) 50 MG  tablet, TAKE 1 TABLET BY MOUTH TWICE DAILY, Disp: 60 tablet, Rfl: 5;  rosuvastatin (CRESTOR) 10 MG tablet, Take 1 tablet (10 mg total) by mouth at bedtime., Disp: 30 tablet, Rfl: 11 Vitamin D, Ergocalciferol, (DRISDOL) 50000 UNITS CAPS, TAKE 1 CAPSULE BY MOUTH EACH WEEK., Disp: 4 capsule, Rfl: 0  The PMH, PSH, Social History, Family History, Medications, and allergies have been reviewed in [facility], and have been updated if relevant.   Review of Systems See HPI    Still having impotence Objective:   Physical Exam .BP 148/92  Temp 98.1 F (36.7 C)  Wt 271 lb (122.925 kg)  General:  overweght male in NAD Psych:  Good eye contact.  Not anxious or depressed appearing.     Assessment & Plan:   1. Back pain   >25 min spent with face to face with patient, >50% counseling and/or coordinating care.  Will refer to PT.  Elavil has been helping tremendously.  Will decrease dose to 12.5 mg nightly during the week as he has felt a little "out of it," the next day. The patient indicates understanding of these issues and agrees with the plan.

## 2012-07-23 NOTE — Patient Instructions (Addendum)
Good to see you. Cut your Elavil in half to 12.5 nightly during the week.  Stop by to see Shirlee Limerick on your way out to set up your Physical Therapy.

## 2012-08-12 ENCOUNTER — Telehealth: Payer: Self-pay | Admitting: Family Medicine

## 2012-08-12 NOTE — Telephone Encounter (Signed)
Pt called today requesting to speak with Dr. Dayton Martes to follow up on his groin pain and how PT is going.  He has an appt scheduled for 08/14/12 but would like to speak with Dr. Dayton Martes before his appt regarding RX for pain medication.

## 2012-08-14 ENCOUNTER — Ambulatory Visit (INDEPENDENT_AMBULATORY_CARE_PROVIDER_SITE_OTHER): Payer: BC Managed Care – PPO | Admitting: Family Medicine

## 2012-08-14 ENCOUNTER — Encounter: Payer: Self-pay | Admitting: Family Medicine

## 2012-08-14 VITALS — BP 140/88 | HR 88 | Temp 98.7°F | Wt 276.0 lb

## 2012-08-14 DIAGNOSIS — M549 Dorsalgia, unspecified: Secondary | ICD-10-CM

## 2012-08-14 DIAGNOSIS — E1140 Type 2 diabetes mellitus with diabetic neuropathy, unspecified: Secondary | ICD-10-CM | POA: Insufficient documentation

## 2012-08-14 DIAGNOSIS — E1149 Type 2 diabetes mellitus with other diabetic neurological complication: Secondary | ICD-10-CM

## 2012-08-14 DIAGNOSIS — R7989 Other specified abnormal findings of blood chemistry: Secondary | ICD-10-CM

## 2012-08-14 DIAGNOSIS — E1142 Type 2 diabetes mellitus with diabetic polyneuropathy: Secondary | ICD-10-CM

## 2012-08-14 DIAGNOSIS — N529 Male erectile dysfunction, unspecified: Secondary | ICD-10-CM

## 2012-08-14 DIAGNOSIS — E291 Testicular hypofunction: Secondary | ICD-10-CM

## 2012-08-14 MED ORDER — SILDENAFIL CITRATE 100 MG PO TABS: 50.0000 mg | ORAL_TABLET | Freq: Every day | ORAL | Status: DC | PRN

## 2012-08-14 NOTE — Patient Instructions (Addendum)
Good to see you.  Please continue with physical therapy and I do think it would be great for you to get back into the gym.

## 2012-08-14 NOTE — Telephone Encounter (Signed)
He is coming in today.  I will speak with him about this at his office visit.

## 2012-08-14 NOTE — Progress Notes (Signed)
Subjective:    Patient ID: Travis Scott, male    DOB: 11-Mar-1981, 32 y.o.   MRN: [id number]  HPI  32 yo with h/o poorly controlled diabetes here for follow up of persistent low back pain, ED and groin pain .  We referred him to urology.  Notes reviewed- he does have low testosterone, but otherwise did not feel it was a urological issue.  Did see some lumbar disc disease on KUB so we proceeded with  Lumbar xray and MRI.  Dg Lumbar Spine Complete  07/17/2012  *RADIOLOGY REPORT*  Clinical Data: Low back pain with bilateral inguinal pain.  LUMBAR SPINE - COMPLETE 4+ VIEW  Comparison: None.  Findings: Image quality is degraded by body habitus and technique. Alignment is anatomic.  Vertebral body and disc space height are maintained.  No definite pars defects.  No significant degenerative changes.  IMPRESSION: No acute findings.   Original Report Authenticated By: Leanna Battles, M.D.    Mr Lumbar Spine Wo Contrast  07/22/2012  *RADIOLOGY REPORT*  Clinical Data: Low back pain with bilateral leg pain.  MRI LUMBAR SPINE WITHOUT CONTRAST  Technique:  Multiplanar and multiecho pulse sequences of the lumbar spine were obtained without intravenous contrast.  Comparison: Lumbar radiographs 07/17/2012  Findings: Normal lumbar alignment.  Negative for fracture or mass lesion.  Conus medullaris is normal and terminates at L1.  L1-2:  Mild disc space narrowing and mild facet degeneration. Negative for disc protrusion or spinal stenosis.  L2-3:  Negative  L3-4:  Mild disc bulging and early facet degeneration without disc protrusion or stenosis. Mild endplate osteophyte formation.  L4-5:  Mild disc degeneration with mild spondylosis.  Mild facet hypertrophy.  No significant spinal stenosis or disc protrusion.  L5-S1:  Shallow chronic-appearing central disc protrusion with associated endplate osteophyte formation.  Discogenic edema in the endplates at L5 and S1.  No acute soft disc protrusion.  No compression of the  nerve roots.  IMPRESSION: Mild disc degeneration and mild spondylosis at L3-4 and L4-5 without significant spinal stenosis.  Chronic appearing central disc protrusion with associated spondylosis L5-S1.  No significant neural impingement.   Original Report Authenticated By: Janeece Riggers, M.D.    Started Elavil last month at bedtime which has been helping with pain. Also referred to PT which he feels has helped tremendously.  Starting to go back to the gym- he wants to lose some weight.  Still having issues with ED despite being treated by urology with androgel.  He can get an erection but cannot maintain it for long.   Diabetes under much better control now.  Highest CBG he has had in weeks is 145!  Patient Active Problem List  Diagnosis  . Hypertension  . Diabetes mellitus  . Angular cheilitis  . Fatigue  . Hyperlipidemia  . Diabetes mellitus out of control  . Back pain  . Left groin pain  . Diabetic neuropathy   Past Medical History  Diagnosis Date  . Hypertension   . Diabetes mellitus    No past surgical history on file. History  Substance Use Topics  . Smoking status: Current Some Day Smoker  . Smokeless tobacco: Not on file  . Alcohol Use: Not on file   Family History  Problem Relation Age of Onset  . Hypertension Mother   . Diabetes Mother   . Diabetes Father   . Hypertension Father    No Known Allergies  Current outpatient prescriptions:amitriptyline (ELAVIL) 25 MG tablet, Take 1 tablet (25 mg  total) by mouth at bedtime., Disp: 30 tablet, Rfl: 0;  amLODipine (NORVASC) 10 MG tablet, Take 1 tablet (10 mg total) by mouth daily., Disp: 30 tablet, Rfl: 5;  diazepam (VALIUM) 5 MG tablet, 1- 2 tabs by mouth as needed for muscle spasm; then may repeat 1-2 tabs by mouth in 3-4 hours, if necessary., Disp: 30 tablet, Rfl: 1 gemfibrozil (LOPID) 600 MG tablet, Take 1 tablet (600 mg total) by mouth 2 (two) times daily., Disp: 60 tablet, Rfl: 11;  glyBURIDE (DIABETA) 5 MG tablet,  TAKE 1 TABLET BY MOUTH EVERY DAY WITH BREAKFAST, Disp: 30 tablet, Rfl: 12;  insulin glargine (LANTUS) 100 UNIT/ML injection, Inject 80 Units into the skin at bedtime., Disp: 10 mL, Rfl: 11 losartan-hydrochlorothiazide (HYZAAR) 100-25 MG per tablet, Take 1 tablet by mouth daily., Disp: 30 tablet, Rfl: 6;  metFORMIN (GLUCOPHAGE) 1000 MG tablet, TAKE 1 TABLET BY MOUTH TWICE DAILY, Disp: 60 tablet, Rfl: 6;  metoprolol (LOPRESSOR) 50 MG tablet, TAKE 1 TABLET BY MOUTH TWICE DAILY, Disp: 60 tablet, Rfl: 5;  rosuvastatin (CRESTOR) 10 MG tablet, Take 1 tablet (10 mg total) by mouth at bedtime., Disp: 30 tablet, Rfl: 11 Vitamin D, Ergocalciferol, (DRISDOL) 50000 UNITS CAPS, TAKE 1 CAPSULE BY MOUTH EACH WEEK., Disp: 4 capsule, Rfl: 0  The PMH, PSH, Social History, Family History, Medications, and allergies have been reviewed in [facility], and have been updated if relevant.   Review of Systems See HPI    Still having impotence Objective:   Physical Exam .BP 140/88  Pulse 88  Temp 98.7 F (37.1 C)  Wt 276 lb (125.193 kg)  General:  overweght male in NAD Psych:  Good eye contact.  Not anxious or depressed appearing.     Assessment & Plan:    1. Back pain  Improved with PT and elavil as needed.  2. Groin pain Likely due to bulding disc.  See above.  3. Low testosterone  On androgel and followed by urology.  4. Erectile dysfunction  Unchanged.  Discussed risks and benefits of Viagra.  He does want to try it.  Rx given.

## 2012-08-21 ENCOUNTER — Other Ambulatory Visit: Payer: Self-pay | Admitting: Family Medicine

## 2013-05-03 ENCOUNTER — Other Ambulatory Visit: Payer: Self-pay | Admitting: Family Medicine

## 2013-05-13 ENCOUNTER — Ambulatory Visit: Payer: Self-pay | Admitting: Otolaryngology

## 2013-05-14 ENCOUNTER — Ambulatory Visit: Payer: Self-pay | Admitting: Otolaryngology

## 2013-06-06 ENCOUNTER — Other Ambulatory Visit: Payer: Self-pay | Admitting: Family Medicine

## 2013-06-29 ENCOUNTER — Ambulatory Visit: Payer: Self-pay | Admitting: Physician Assistant

## 2013-06-29 LAB — COMPREHENSIVE METABOLIC PANEL
Anion Gap: 12 (ref 7–16)
BUN: 12 mg/dL (ref 7–18)
EGFR (Non-African Amer.): >60
Glucose: 124 mg/dL — ABNORMAL HIGH (ref 65–99)
Osmolality: 281 (ref 275–301)
Potassium: 4.2 mmol/L (ref 3.5–5.1)
SGPT (ALT): 40 U/L (ref 12–78)
Sodium: 140 mmol/L (ref 136–145)
Total Protein: 8.1 g/dL (ref 6.4–8.2)

## 2013-07-02 ENCOUNTER — Other Ambulatory Visit: Payer: Self-pay | Admitting: Family Medicine

## 2013-07-02 MED ORDER — AMLODIPINE BESYLATE 10 MG PO TABS: 10.0000 mg | ORAL_TABLET | Freq: Every day | ORAL | Status: DC

## 2013-07-02 NOTE — Telephone Encounter (Signed)
Rx sent through e-scribe  

## 2013-07-02 NOTE — Telephone Encounter (Signed)
30 day refill and will need appt before further refills provided

## 2013-07-02 NOTE — Addendum Note (Signed)
Addended by: Roena Malady on: 07/02/2013 02:23 PM   Modules accepted: Orders

## 2013-07-02 NOTE — Telephone Encounter (Signed)
Pt requesting medication refill. Last appt 07/2012 with no future appts. pls advise

## 2013-07-07 ENCOUNTER — Ambulatory Visit: Payer: Self-pay | Admitting: Family Medicine

## 2013-07-07 ENCOUNTER — Other Ambulatory Visit: Payer: Self-pay | Admitting: Family Medicine

## 2013-07-07 LAB — OCCULT BLOOD X 1 CARD TO LAB, STOOL: Occult Blood, Feces: NEGATIVE

## 2013-07-07 LAB — CLOSTRIDIUM DIFFICILE(ARMC)

## 2013-07-07 NOTE — Telephone Encounter (Signed)
Pt left v/m cking on status of losartan HCTZ refill to [facility]. Pt last seen 08/14/12.Please advise.

## 2013-07-07 NOTE — Telephone Encounter (Signed)
Give 30 day supply, will need to be seen for further refills 

## 2013-07-10 LAB — STOOL CULTURE

## 2013-08-21 ENCOUNTER — Other Ambulatory Visit: Payer: Self-pay | Admitting: Family Medicine

## 2013-08-21 ENCOUNTER — Other Ambulatory Visit: Payer: Self-pay | Admitting: Internal Medicine

## 2013-08-25 NOTE — Telephone Encounter (Signed)
Ok to refill one month only.  Needs to be seen for CPX for further refills.

## 2013-08-25 NOTE — Telephone Encounter (Signed)
Last office visit 08/14/2012.  No upcoming appointments scheduled.  Refill?

## 2013-09-03 ENCOUNTER — Other Ambulatory Visit: Payer: Self-pay | Admitting: *Deleted

## 2013-09-03 MED ORDER — GEMFIBROZIL 600 MG PO TABS: ORAL_TABLET | ORAL | Status: DC

## 2013-09-03 MED ORDER — LOSARTAN POTASSIUM-HCTZ 100-25 MG PO TABS: 1.0000 | ORAL_TABLET | Freq: Every day | ORAL | Status: DC

## 2013-09-03 MED ORDER — GLYBURIDE 5 MG PO TABS: ORAL_TABLET | ORAL | Status: DC

## 2013-09-04 MED ORDER — LOSARTAN POTASSIUM-HCTZ 100-25 MG PO TABS: 1.0000 | ORAL_TABLET | Freq: Every day | ORAL | Status: DC

## 2013-09-04 MED ORDER — GEMFIBROZIL 600 MG PO TABS: ORAL_TABLET | ORAL | Status: DC

## 2013-09-04 MED ORDER — GLYBURIDE 5 MG PO TABS: ORAL_TABLET | ORAL | Status: DC

## 2013-09-04 NOTE — Addendum Note (Signed)
Addended by: Desmond DikeKNIGHT, Novelle Addair H on: 09/04/2013 11:43 AM   Modules accepted: Orders, Medications

## 2013-09-04 NOTE — Addendum Note (Signed)
Addended by: Desmond DikeKNIGHT, Clairessa Boulet H on: 09/04/2013 11:42 AM   Modules accepted: Orders

## 2013-09-04 NOTE — Addendum Note (Signed)
Addended by: Desmond DikeKNIGHT, Darwin Rothlisberger H on: 09/04/2013 11:24 AM   Modules accepted: Orders

## 2013-09-07 ENCOUNTER — Encounter: Payer: Self-pay | Admitting: Family Medicine

## 2013-09-07 ENCOUNTER — Ambulatory Visit (INDEPENDENT_AMBULATORY_CARE_PROVIDER_SITE_OTHER): Payer: BC Managed Care – PPO | Admitting: Family Medicine

## 2013-09-07 VITALS — BP 142/88 | HR 63 | Temp 97.9°F | Ht 71.0 in | Wt 268.5 lb

## 2013-09-07 DIAGNOSIS — E1165 Type 2 diabetes mellitus with hyperglycemia: Secondary | ICD-10-CM

## 2013-09-07 DIAGNOSIS — Z720 Tobacco use: Secondary | ICD-10-CM | POA: Insufficient documentation

## 2013-09-07 DIAGNOSIS — E785 Hyperlipidemia, unspecified: Secondary | ICD-10-CM

## 2013-09-07 DIAGNOSIS — J069 Acute upper respiratory infection, unspecified: Secondary | ICD-10-CM

## 2013-09-07 DIAGNOSIS — F17200 Nicotine dependence, unspecified, uncomplicated: Secondary | ICD-10-CM

## 2013-09-07 DIAGNOSIS — I10 Essential (primary) hypertension: Secondary | ICD-10-CM

## 2013-09-07 DIAGNOSIS — IMO0002 Reserved for concepts with insufficient information to code with codable children: Secondary | ICD-10-CM

## 2013-09-07 DIAGNOSIS — IMO0001 Reserved for inherently not codable concepts without codable children: Secondary | ICD-10-CM

## 2013-09-07 LAB — COMPREHENSIVE METABOLIC PANEL
ALBUMIN: 3.9 g/dL (ref 3.5–5.2)
ALK PHOS: 64 U/L (ref 39–117)
ALT: 42 U/L (ref 0–53)
AST: 22 U/L (ref 0–37)
BUN: 8 mg/dL (ref 6–23)
CALCIUM: 9.6 mg/dL (ref 8.4–10.5)
CHLORIDE: 102 meq/L (ref 96–112)
CO2: 31 mEq/L (ref 19–32)
Creatinine, Ser: 0.7 mg/dL (ref 0.4–1.5)
GFR: 155 mL/min (ref >60.00)
Glucose, Bld: 96 mg/dL (ref 70–99)
POTASSIUM: 4 meq/L (ref 3.5–5.1)
SODIUM: 139 meq/L (ref 135–145)
TOTAL PROTEIN: 7.4 g/dL (ref 6.0–8.3)
Total Bilirubin: 0.7 mg/dL (ref 0.3–1.2)

## 2013-09-07 LAB — LIPID PANEL
Cholesterol: 177 mg/dL (ref 0–200)
HDL: 44.8 mg/dL (ref >39.00)
LDL Cholesterol: 119 mg/dL — ABNORMAL HIGH (ref 0–99)
Total CHOL/HDL Ratio: 4
Triglycerides: 65 mg/dL (ref 0.0–149.0)
VLDL: 13 mg/dL (ref 0.0–40.0)

## 2013-09-07 LAB — MICROALBUMIN / CREATININE URINE RATIO
Creatinine,U: 131.5 mg/dL
MICROALB UR: 1 mg/dL (ref 0.0–1.9)
Microalb Creat Ratio: 0.8 mg/g (ref 0.0–30.0)

## 2013-09-07 LAB — HEMOGLOBIN A1C: Hgb A1c MFr Bld: 8.2 % — ABNORMAL HIGH (ref 4.6–6.5)

## 2013-09-07 MED ORDER — AMLODIPINE BESYLATE 10 MG PO TABS: 10.0000 mg | ORAL_TABLET | Freq: Every day | ORAL | Status: DC

## 2013-09-07 MED ORDER — HYDROCOD POLST-CHLORPHEN POLST 10-8 MG/5ML PO LQCR: 5.0000 mL | Freq: Every evening | ORAL | Status: DC | PRN

## 2013-09-07 MED ORDER — METFORMIN HCL 1000 MG PO TABS: ORAL_TABLET | ORAL | Status: DC

## 2013-09-07 MED ORDER — METOPROLOL TARTRATE 50 MG PO TABS: 50.0000 mg | ORAL_TABLET | Freq: Two times a day (BID) | ORAL | Status: DC

## 2013-09-07 MED ORDER — SILDENAFIL CITRATE 100 MG PO TABS: 50.0000 mg | ORAL_TABLET | Freq: Every day | ORAL | Status: DC | PRN

## 2013-09-07 MED ORDER — GLYBURIDE 5 MG PO TABS: ORAL_TABLET | ORAL | Status: DC

## 2013-09-07 NOTE — Assessment & Plan Note (Signed)
RXs refilled. No longer followed by endo. Check labs today. He has eye appt next month. Orders Placed This Encounter  Procedures  . Hemoglobin A1c  . Lipid Panel  . Comprehensive metabolic panel  . Microalbumin/Creatinine Ratio, Urine  . HM DIABETES FOOT EXAM

## 2013-09-07 NOTE — Assessment & Plan Note (Addendum)
Exam benign and reassuring. Likely viral. Continue supportive care. Rx given for tussionex for prn cough (nightly). Call or return to clinic prn if these symptoms worsen or fail to improve as anticipated. The patient indicates understanding of these issues and agrees with the plan.

## 2013-09-07 NOTE — Patient Instructions (Signed)
Great to see you. We will call you with your lab results and you can view them online.  

## 2013-09-07 NOTE — Assessment & Plan Note (Signed)
Quit smoking!!! Congratulated him on his success.

## 2013-09-07 NOTE — Progress Notes (Signed)
Pre visit review using our clinic review tool, if applicable. No additional management support is needed unless otherwise documented below in the visit note. 

## 2013-09-07 NOTE — Assessment & Plan Note (Signed)
Elevated today but ran out of meds. Rxs refilled.

## 2013-09-07 NOTE — Progress Notes (Signed)
Subjective:    Patient ID: Travis Scott, male    DOB: [birth year], 33 y.o.   MRN: [id number]  HPI  33 yo here for follow up and rx refills. Also has cough and chest congestion.     DM- diagnosed in 2004.  Referred him to endocrinology but he was lost to follow up.  Has not been to them in almost a year.  Lab Results  Component Value Date   HGBA1C 15.5* 12/06/2011   Currently taking Metformin 1000 mg twice daily and glyburide 5 mg daily, and Lantus 80 units qhs.   Checks his CBGs two times weekly, usually ranging 100-105 fasting. Denies any episodes of hypoglycemia.  HLD- Lab Results  Component Value Date   CHOL 192 12/06/2011   HDL 39.60 12/06/2011   LDLCALC 133* 12/06/2011   LDLDIRECT 191.3 04/10/2011   TRIG 97.0 12/06/2011   CHOLHDL 5 12/06/2011      HTN- diagnosed as a teenager. On Metoprolol 50 mg twice daily, losartan/hctz 100/25 mg daily, and amlodipine 10 mg daily.  Ran out of meds two days ago. No CP, SOB or blurred vision. Lab Results  Component Value Date   CREATININE 0.7 12/06/2011    Quit smoking 4 months ago!!  Has had a runny nose, cough and congestion for a few days.  Cough is non productive.  No fevers or chills. No CP or SOB.  Patient Active Problem List   Diagnosis Date Noted  . Diabetic neuropathy 08/14/2012  . Low testosterone 08/14/2012  . Erectile dysfunction 08/14/2012  . Back pain 03/26/2012  . Diabetes mellitus out of control 12/06/2011  . Angular cheilitis 04/10/2011  . Fatigue 04/10/2011  . Hyperlipidemia 04/10/2011  . Hypertension    Past Medical History  Diagnosis Date  . Hypertension   . Diabetes mellitus    No past surgical history on file. History  Substance Use Topics  . Smoking status: Current Some Day Smoker  . Smokeless tobacco: Not on file  . Alcohol Use: Not on file   Family History  Problem Relation Age of Onset  . Hypertension Mother   . Diabetes Mother   . Diabetes Father   . Hypertension Father    No  Known Allergies  Current outpatient prescriptions:amLODipine (NORVASC) 10 MG tablet, Take 1 tablet (10 mg total) by mouth daily., Disp: 30 tablet, Rfl: 0;  gemfibrozil (LOPID) 600 MG tablet, TAKE 1 TABLET BY MOUTH TWICE DAILY ., Disp: 180 tablet, Rfl: 0;  glyBURIDE (DIABETA) 5 MG tablet, TAKE 1 TABLET BY MOUTH EVERY DAY WITH BREAKFAST, Disp: 90 tablet, Rfl: 0 insulin glargine (LANTUS) 100 UNIT/ML injection, Inject 80 Units into the skin at bedtime., Disp: 10 mL, Rfl: 11;  losartan-hydrochlorothiazide (HYZAAR) 100-25 MG per tablet, Take 1 tablet by mouth daily., Disp: 90 tablet, Rfl: 0;  metFORMIN (GLUCOPHAGE) 1000 MG tablet, TAKE 1 TABLET BY MOUTH TWICE DAILY, Disp: 60 tablet, Rfl: 0 metoprolol (LOPRESSOR) 50 MG tablet, Take 1 tablet (50 mg total) by mouth 2 (two) times daily. Needs office visit prior to any additional refills, Disp: 60 tablet, Rfl: 0;  sildenafil (VIAGRA) 100 MG tablet, Take 0.5-1 tablets (50-100 mg total) by mouth daily as needed for erectile dysfunction., Disp: 5 tablet, Rfl: 11  The PMH, PSH, Social History, Family History, Medications, and allergies have been reviewed in [facility], and have been updated if relevant.   Review of Systems See HPI    Objective:   Physical Exam BP 142/88  Pulse 63  Temp(Src) 97.9 F (36.6  C) (Oral)  Ht 5\' 11"  (1.803 m)  Wt 268 lb 8 oz (121.791 kg)  BMI 37.46 kg/m2  SpO2 99% General:  overweght male in NAD Eyes:  PERRL Ears:  External ear exam shows no significant lesions or deformities.  Otoscopic examination reveals clear canals, tympanic membranes are intact bilaterally without bulging, retraction, inflammation or discharge. Hearing is grossly normal bilaterally. Nose:  External nasal examination shows no deformity or inflammation. Nasal mucosa are pink and moist without lesions or exudates. Neck:  no carotid bruit or thyromegaly no cervical or supraclavicular lymphadenopathy  Lungs:  Normal respiratory effort, chest expands symmetrically.  Lungs are clear to auscultation, no crackles or wheezes. Heart:  Normal rate and regular rhythm. S1 and S2 normal without gallop, murmur, click, rub or other extra sounds. Abdomen:  Bowel sounds positive,abdomen soft and non-tender without masses, organomegaly or hernias noted. Pulses:  R and L posterior tibial pulses are full and equal bilaterally  Extremities:  no edema  Diabetic foot exam: Normal inspection No skin breakdown No calluses  Normal DP pulses Normal sensation to light touch and monofilament Nails normal         Assessment & Plan:

## 2013-09-08 ENCOUNTER — Other Ambulatory Visit: Payer: Self-pay

## 2013-09-08 ENCOUNTER — Telehealth: Payer: Self-pay | Admitting: Family Medicine

## 2013-09-08 MED ORDER — LOSARTAN POTASSIUM-HCTZ 100-25 MG PO TABS: 1.0000 | ORAL_TABLET | Freq: Every day | ORAL | Status: DC

## 2013-09-08 MED ORDER — AMLODIPINE BESYLATE 10 MG PO TABS: 10.0000 mg | ORAL_TABLET | Freq: Every day | ORAL | Status: DC

## 2013-09-08 NOTE — Telephone Encounter (Signed)
Relevant patient education assigned to patient using Emmi. ° °

## 2013-09-08 NOTE — Addendum Note (Signed)
Addended by: Damita LackLORING, DONNA S on: 09/08/2013 11:31 AM   Modules accepted: Orders

## 2013-09-08 NOTE — Telephone Encounter (Signed)
re

## 2013-11-16 ENCOUNTER — Ambulatory Visit (INDEPENDENT_AMBULATORY_CARE_PROVIDER_SITE_OTHER): Payer: BC Managed Care – PPO | Admitting: Internal Medicine

## 2013-11-16 ENCOUNTER — Encounter: Payer: Self-pay | Admitting: Internal Medicine

## 2013-11-16 VITALS — BP 138/80 | HR 71 | Temp 98.6°F | Wt 260.0 lb

## 2013-11-16 DIAGNOSIS — IMO0001 Reserved for inherently not codable concepts without codable children: Secondary | ICD-10-CM | POA: Insufficient documentation

## 2013-11-16 DIAGNOSIS — L03019 Cellulitis of unspecified finger: Secondary | ICD-10-CM

## 2013-11-16 MED ORDER — CEPHALEXIN 500 MG PO TABS: 500.0000 mg | ORAL_TABLET | Freq: Four times a day (QID) | ORAL | Status: DC

## 2013-11-16 NOTE — Assessment & Plan Note (Signed)
Not sure if there is ingrown nail Will treat with epsom soaks Cephalexin May need nail trimmed with digital block if persists

## 2013-11-16 NOTE — Progress Notes (Signed)
   Subjective:    Patient ID: Travis Scott, male    DOB: 23-May-1981, 33 y.o.   MRN: [id number]  HPI Has ingrown nail in right 3rd finger Some swelling Painful No drainage Started yesterday  No injury Tried soaking in warm water and put on peroxide The soaks helped slightly  Current Outpatient Prescriptions on File Prior to Visit  Medication Sig Dispense Refill  . amLODipine (NORVASC) 10 MG tablet Take 1 tablet (10 mg total) by mouth daily.  90 tablet  2  . gemfibrozil (LOPID) 600 MG tablet TAKE 1 TABLET BY MOUTH TWICE DAILY .  180 tablet  0  . glyBURIDE (DIABETA) 5 MG tablet TAKE 1 TABLET BY MOUTH EVERY DAY WITH BREAKFAST  30 tablet  0  . insulin glargine (LANTUS) 100 UNIT/ML injection Inject 85 Units into the skin at bedtime.      Marland Kitchen. losartan-hydrochlorothiazide (HYZAAR) 100-25 MG per tablet Take 1 tablet by mouth daily.  30 tablet  0  . metFORMIN (GLUCOPHAGE) 1000 MG tablet TAKE 1 TABLET BY MOUTH TWICE DAILY  60 tablet  0  . metoprolol (LOPRESSOR) 50 MG tablet Take 1 tablet (50 mg total) by mouth 2 (two) times daily.  60 tablet  0  . sildenafil (VIAGRA) 100 MG tablet Take 0.5-1 tablets (50-100 mg total) by mouth daily as needed for erectile dysfunction.  5 tablet  11   No current facility-administered medications on file prior to visit.    No Known Allergies  Past Medical History  Diagnosis Date  . Hypertension   . Diabetes mellitus     No past surgical history on file.  Family History  Problem Relation Age of Onset  . Hypertension Mother   . Diabetes Mother   . Diabetes Father   . Hypertension Father     History   Social History  . Marital Status: Married    Spouse Name: N/A    Number of Children: 2  . Years of Education: N/A   Occupational History  .     Social History Main Topics  . Smoking status: Former Games developermoker  . Smokeless tobacco: Never Used  . Alcohol Use: No  . Drug Use: No  . Sexual Activity: Yes   Other Topics Concern  . Not on file    Social History Narrative  . No narrative on file   Review of Systems No history No GI symptoms     Objective:   Physical Exam  Musculoskeletal:  Mild puffiness and tenderness along radial side of right 3rd nail No discharge          Assessment & Plan:

## 2013-11-16 NOTE — Patient Instructions (Signed)
Paronychia Paronychia is an inflammatory reaction involving the folds of the skin surrounding the fingernail. This is commonly caused by an infection in the skin around a nail. The most common cause of paronychia is frequent wetting of the hands (as seen with bartenders, food servers, nurses or others who wet their hands). This makes the skin around the fingernail susceptible to infection by bacteria (germs) or fungus. Other predisposing factors are:  Aggressive manicuring.  Nail biting.  Thumb sucking. The most common cause is a staphylococcal (a type of germ) infection, or a fungal (Candida) infection. When caused by a germ, it usually comes on suddenly with redness, swelling, pus and is often painful. It may get under the nail and form an abscess (collection of pus), or form an abscess around the nail. If the nail itself is infected with a fungus, the treatment is usually prolonged and may require oral medicine for up to one year. Your caregiver will determine the length of time treatment is required. The paronychia caused by bacteria (germs) may largely be avoided by not pulling on hangnails or picking at cuticles. When the infection occurs at the tips of the finger it is called felon. When the cause of paronychia is from the herpes simplex virus (HSV) it is called herpetic whitlow. TREATMENT  When an abscess is present treatment is often incision and drainage. This means that the abscess must be cut open so the pus can get out. When this is done, the following home care instructions should be followed. HOME CARE INSTRUCTIONS   It is important to keep the affected fingers very dry. Rubber or plastic gloves over cotton gloves should be used whenever the hand must be placed in water.  Keep wound clean, dry and dressed as suggested by your caregiver between warm soaks or warm compresses.  Soak in warm water for fifteen to twenty minutes three to four times per day for bacterial infections. Fungal  infections are very difficult to treat, so often require treatment for long periods of time.  For bacterial (germ) infections take antibiotics (medicine which kill germs) as directed and finish the prescription, even if the problem appears to be solved before the medicine is gone.  Only take over-the-counter or prescription medicines for pain, discomfort, or fever as directed by your caregiver. SEEK IMMEDIATE MEDICAL CARE IF:  You have redness, swelling, or increasing pain in the wound.  You notice pus coming from the wound.  You have a fever.  You notice a bad smell coming from the wound or dressing. Document Released: 12/26/2000 Document Revised: 09/24/2011 Document Reviewed: 08/27/2008 Exit[employer] Patient Information 2014 [employer].  

## 2013-11-16 NOTE — Progress Notes (Signed)
Pre visit review using our clinic review tool, if applicable. No additional management support is needed unless otherwise documented below in the visit note. 

## 2013-11-27 ENCOUNTER — Ambulatory Visit: Payer: Self-pay | Admitting: Emergency Medicine

## 2013-12-01 ENCOUNTER — Other Ambulatory Visit: Payer: Self-pay | Admitting: *Deleted

## 2013-12-01 MED ORDER — METOPROLOL TARTRATE 50 MG PO TABS: 50.0000 mg | ORAL_TABLET | Freq: Two times a day (BID) | ORAL | Status: DC

## 2013-12-01 MED ORDER — METFORMIN HCL 1000 MG PO TABS: ORAL_TABLET | ORAL | Status: DC

## 2014-01-22 ENCOUNTER — Telehealth: Payer: Self-pay | Admitting: *Deleted

## 2014-01-22 NOTE — Telephone Encounter (Signed)
*  DIABETIC BUNDLE*  Left voicemail requesting pt to call office back and schedule a f/u with Dr. Dayton MartesAron with a fasting lab appt prior (needs lipid profile and a1c)

## 2014-02-01 NOTE — Telephone Encounter (Signed)
Patient called and scheduled a lab appt.

## 2014-02-03 ENCOUNTER — Other Ambulatory Visit: Payer: Self-pay | Admitting: Family Medicine

## 2014-02-03 DIAGNOSIS — E785 Hyperlipidemia, unspecified: Secondary | ICD-10-CM

## 2014-02-03 DIAGNOSIS — IMO0002 Reserved for concepts with insufficient information to code with codable children: Secondary | ICD-10-CM

## 2014-02-03 DIAGNOSIS — E1165 Type 2 diabetes mellitus with hyperglycemia: Secondary | ICD-10-CM

## 2014-02-04 ENCOUNTER — Other Ambulatory Visit: Payer: BC Managed Care – PPO

## 2014-03-04 ENCOUNTER — Other Ambulatory Visit: Payer: Self-pay | Admitting: Family Medicine

## 2014-03-08 ENCOUNTER — Other Ambulatory Visit: Payer: Self-pay | Admitting: *Deleted

## 2014-03-08 NOTE — Telephone Encounter (Signed)
Received faxed refill request from pharmacy. Last office visit 09/07/13, patient no showed for last lab appointment.02/04/14.   Is it okay to refill medication?

## 2014-03-08 NOTE — Telephone Encounter (Signed)
Lm on pt's requesting a call back; attempting to find out which local pharmacy pt is wanting meds sent to. Unable to have #90 sent to [facility]

## 2014-03-08 NOTE — Telephone Encounter (Signed)
Needs lab visit- only refill one time only.

## 2014-03-09 MED ORDER — METFORMIN HCL 1000 MG PO TABS: ORAL_TABLET | ORAL | Status: DC

## 2014-03-09 MED ORDER — METOPROLOL TARTRATE 50 MG PO TABS: 50.0000 mg | ORAL_TABLET | Freq: Two times a day (BID) | ORAL | Status: DC

## 2014-03-09 NOTE — Telephone Encounter (Signed)
Spoke to pt and confirmed local pharmacy; pt advised to schedule f/u at earliest convenience

## 2014-04-14 ENCOUNTER — Other Ambulatory Visit (INDEPENDENT_AMBULATORY_CARE_PROVIDER_SITE_OTHER): Payer: BC Managed Care – PPO

## 2014-04-14 DIAGNOSIS — E1165 Type 2 diabetes mellitus with hyperglycemia: Secondary | ICD-10-CM

## 2014-04-14 DIAGNOSIS — E785 Hyperlipidemia, unspecified: Secondary | ICD-10-CM

## 2014-04-14 DIAGNOSIS — IMO0001 Reserved for inherently not codable concepts without codable children: Secondary | ICD-10-CM

## 2014-04-14 DIAGNOSIS — IMO0002 Reserved for concepts with insufficient information to code with codable children: Secondary | ICD-10-CM

## 2014-04-14 LAB — MICROALBUMIN / CREATININE URINE RATIO
CREATININE, U: 220.2 mg/dL
Microalb Creat Ratio: 0.2 mg/g (ref 0.0–30.0)
Microalb, Ur: 0.5 mg/dL (ref 0.0–1.9)

## 2014-04-14 LAB — LIPID PANEL
Cholesterol: 167 mg/dL (ref 0–200)
HDL: 41.6 mg/dL (ref >39.00)
LDL Cholesterol: 114 mg/dL — ABNORMAL HIGH (ref 0–99)
NONHDL: 125.4
Total CHOL/HDL Ratio: 4
Triglycerides: 55 mg/dL (ref 0.0–149.0)
VLDL: 11 mg/dL (ref 0.0–40.0)

## 2014-04-14 LAB — HEMOGLOBIN A1C: Hgb A1c MFr Bld: 7.5 % — ABNORMAL HIGH (ref 4.6–6.5)

## 2014-04-15 ENCOUNTER — Encounter: Payer: Self-pay | Admitting: Family Medicine

## 2014-04-15 ENCOUNTER — Ambulatory Visit (INDEPENDENT_AMBULATORY_CARE_PROVIDER_SITE_OTHER): Payer: BC Managed Care – PPO | Admitting: Family Medicine

## 2014-04-15 VITALS — BP 120/78 | HR 69 | Temp 98.4°F | Wt 256.8 lb

## 2014-04-15 DIAGNOSIS — E11649 Type 2 diabetes mellitus with hypoglycemia without coma: Secondary | ICD-10-CM

## 2014-04-15 DIAGNOSIS — E1165 Type 2 diabetes mellitus with hyperglycemia: Secondary | ICD-10-CM

## 2014-04-15 DIAGNOSIS — IMO0002 Reserved for concepts with insufficient information to code with codable children: Secondary | ICD-10-CM

## 2014-04-15 DIAGNOSIS — I10 Essential (primary) hypertension: Secondary | ICD-10-CM

## 2014-04-15 DIAGNOSIS — E785 Hyperlipidemia, unspecified: Secondary | ICD-10-CM

## 2014-04-15 DIAGNOSIS — E669 Obesity, unspecified: Secondary | ICD-10-CM

## 2014-04-15 MED ORDER — METFORMIN HCL 1000 MG PO TABS: ORAL_TABLET | ORAL | Status: DC

## 2014-04-15 MED ORDER — METOPROLOL TARTRATE 50 MG PO TABS: 50.0000 mg | ORAL_TABLET | Freq: Two times a day (BID) | ORAL | Status: DC

## 2014-04-15 MED ORDER — GLYBURIDE 1.25 MG PO TABS: ORAL_TABLET | ORAL | Status: DC

## 2014-04-15 NOTE — Patient Instructions (Signed)
Good to see you. Great work!  Keep working on [employer]. Holdingsyour diet and exercise.  I will call you with your lab results.

## 2014-04-15 NOTE — Assessment & Plan Note (Signed)
Improving, almost at goal but with severe hypoglycemia. Will decrease dose of glyburide to 1.25 mg daily.  He will update me with his blood sugars.  We may to increase dose of lantus.

## 2014-04-15 NOTE — Assessment & Plan Note (Signed)
At goal for diabetic. No changes made to current rx.

## 2014-04-15 NOTE — Assessment & Plan Note (Signed)
New- see above for plan.

## 2014-04-15 NOTE — Progress Notes (Signed)
Pre visit review using our clinic review tool, if applicable. No additional management support is needed unless otherwise documented below in the visit note. 

## 2014-04-15 NOTE — Assessment & Plan Note (Signed)
Not quite at goal for a diabetic. Discussed adding statin, he wants to continue working on diet and weight loss first. Follow up in 3 months. The patient indicates understanding of these issues and agrees with the plan.

## 2014-04-15 NOTE — Progress Notes (Signed)
Subjective:    Patient ID: Travis Scott, male    DOB: 1980/11/07, 33 y.o.   MRN: [id number]  HPI  33 yo here for follow up and rx refills.   DM- diagnosed in 2004.  Referred him to endocrinology but he was lost to follow up.  Has not been to them in almost a year.  Lab Results  Component Value Date   HGBA1C 7.5* 04/14/2014   Currently taking Metformin 1000 mg twice daily and glyburide 5 mg daily, and Lantus 85 units qhs.   Checks his CBGs two times weekly, usually ranging around 100 fasting. Has had some episodes of hypoglycemia in the 40-60s.  One more was as low as 26!  Does get shaky and nauseated this happens.  No LOC.  Feels it is related to his gylburide.  Has been exercising and going to the gym- losing weight. Neg urine micro.  HLD-  Not quite at goal for a diabetic. He is taking lopid but not a statin. Lab Results  Component Value Date   CHOL 167 04/14/2014   HDL 41.60 04/14/2014   LDLCALC 114* 04/14/2014   LDLDIRECT 191.3 04/10/2011   TRIG 55.0 04/14/2014   CHOLHDL 4 04/14/2014   Wt Readings from Last 3 Encounters:  04/15/14 256 lb 12 oz (116.461 kg)  11/16/13 260 lb (117.935 kg)  09/07/13 268 lb 8 oz (121.791 kg)    HTN- diagnosed as a teenager. On Metoprolol 50 mg twice daily, losartan/hctz 100/25 mg daily, and amlodipine 10 mg daily.  No CP, SOB or blurred vision. Lab Results  Component Value Date   CREATININE 0.7 09/07/2013    No CP or SOB.  Patient Active Problem List   Diagnosis Date Noted  . Paronychia of third finger of right hand 11/16/2013  . Tobacco abuse 09/07/2013  . Acute upper respiratory infections of unspecified site 09/07/2013  . Diabetic neuropathy 08/14/2012  . Low testosterone 08/14/2012  . Erectile dysfunction 08/14/2012  . Back pain 03/26/2012  . Diabetes mellitus out of control 12/06/2011  . Fatigue 04/10/2011  . Hyperlipidemia 04/10/2011  . Hypertension    Past Medical History  Diagnosis Date  . Hypertension   .  Diabetes mellitus    No past surgical history on file. History  Substance Use Topics  . Smoking status: Former Games developermoker  . Smokeless tobacco: Never Used  . Alcohol Use: No   Family History  Problem Relation Age of Onset  . Hypertension Mother   . Diabetes Mother   . Diabetes Father   . Hypertension Father    No Known Allergies  Current outpatient prescriptions:amLODipine (NORVASC) 10 MG tablet, Take 1 tablet (10 mg total) by mouth daily., Disp: 90 tablet, Rfl: 2;  gemfibrozil (LOPID) 600 MG tablet, TAKE 1 TABLET BY MOUTH TWICE DAILY ., Disp: 180 tablet, Rfl: 0;  glyBURIDE (DIABETA) 5 MG tablet, TAKE 1 TABLET DAILY WITH BREAKFAST, Disp: 90 tablet, Rfl: 0;  insulin glargine (LANTUS) 100 UNIT/ML injection, Inject 85 Units into the skin at bedtime., Disp: , Rfl:  losartan-hydrochlorothiazide (HYZAAR) 100-25 MG per tablet, TAKE 1 TABLET DAILY, Disp: 90 tablet, Rfl: 0;  metFORMIN (GLUCOPHAGE) 1000 MG tablet, TAKE 1 TABLET BY MOUTH TWICE DAILY, Disp: 180 tablet, Rfl: 0;  metoprolol (LOPRESSOR) 50 MG tablet, Take 1 tablet (50 mg total) by mouth 2 (two) times daily., Disp: 180 tablet, Rfl: 0 sildenafil (VIAGRA) 100 MG tablet, Take 0.5-1 tablets (50-100 mg total) by mouth daily as needed for erectile dysfunction., Disp: 5 tablet,  Rfl: 11  The PMH, PSH, Social History, Family History, Medications, and allergies have been reviewed in [facility], and have been updated if relevant.   Review of Systems See HPI    Objective:   Physical Exam BP 120/78  Pulse 69  Temp(Src) 98.4 F (36.9 C) (Oral)  Wt 256 lb 12 oz (116.461 kg)  SpO2 98% General:  overweght male in NAD Eyes:  PERRL Ears:  External ear exam shows no significant lesions or deformities.  Otoscopic examination reveals clear canals, tympanic membranes are intact bilaterally without bulging, retraction, inflammation or discharge. Hearing is grossly normal bilaterally. Nose:  External nasal examination shows no deformity or inflammation. Nasal  mucosa are pink and moist without lesions or exudates. Neck:  no carotid bruit or thyromegaly no cervical or supraclavicular lymphadenopathy  Lungs:  Normal respiratory effort, chest expands symmetrically. Lungs are clear to auscultation, no crackles or wheezes. Heart:  Normal rate and regular rhythm. S1 and S2 normal without gallop, murmur, click, rub or other extra sounds. Abdomen:  Bowel sounds positive,abdomen soft and non-tender without masses, organomegaly or hernias noted. Pulses:  R and L posterior tibial pulses are full and equal bilaterally  Extremities:  no edema         Assessment & Plan:

## 2014-04-15 NOTE — Assessment & Plan Note (Signed)
Improving with diet and exercise. Encouraged him to continue. The patient indicates understanding of these issues and agrees with the plan.

## 2014-04-16 ENCOUNTER — Telehealth: Payer: Self-pay | Admitting: Family Medicine

## 2014-04-16 NOTE — Telephone Encounter (Signed)
emmi emailed °

## 2014-12-28 ENCOUNTER — Ambulatory Visit (INDEPENDENT_AMBULATORY_CARE_PROVIDER_SITE_OTHER): Payer: PRIVATE HEALTH INSURANCE | Admitting: Family Medicine

## 2014-12-28 ENCOUNTER — Encounter: Payer: Self-pay | Admitting: Family Medicine

## 2014-12-28 VITALS — BP 130/76 | HR 62 | Temp 98.0°F | Ht 71.0 in | Wt 247.5 lb

## 2014-12-28 DIAGNOSIS — IMO0002 Reserved for concepts with insufficient information to code with codable children: Secondary | ICD-10-CM

## 2014-12-28 DIAGNOSIS — E11649 Type 2 diabetes mellitus with hypoglycemia without coma: Secondary | ICD-10-CM | POA: Diagnosis not present

## 2014-12-28 DIAGNOSIS — I10 Essential (primary) hypertension: Secondary | ICD-10-CM | POA: Diagnosis not present

## 2014-12-28 DIAGNOSIS — Z23 Encounter for immunization: Secondary | ICD-10-CM

## 2014-12-28 DIAGNOSIS — Z0000 Encounter for general adult medical examination without abnormal findings: Secondary | ICD-10-CM

## 2014-12-28 DIAGNOSIS — E1165 Type 2 diabetes mellitus with hyperglycemia: Secondary | ICD-10-CM | POA: Diagnosis not present

## 2014-12-28 LAB — MICROALBUMIN / CREATININE URINE RATIO
Creatinine,U: 74.3 mg/dL
MICROALB/CREAT RATIO: 0.9 mg/g (ref 0.0–30.0)

## 2014-12-28 LAB — LIPID PANEL
Cholesterol: 180 mg/dL (ref 0–200)
HDL: 52.4 mg/dL (ref >39.00)
LDL Cholesterol: 117 mg/dL — ABNORMAL HIGH (ref 0–99)
NONHDL: 127.6
Total CHOL/HDL Ratio: 3
Triglycerides: 53 mg/dL (ref 0.0–149.0)
VLDL: 10.6 mg/dL (ref 0.0–40.0)

## 2014-12-28 LAB — CBC WITH DIFFERENTIAL/PLATELET
BASOS ABS: 0 10*3/uL (ref 0.0–0.1)
Basophils Relative: 0.6 % (ref 0.0–3.0)
Eosinophils Absolute: 0.2 10*3/uL (ref 0.0–0.7)
Eosinophils Relative: 2.4 % (ref 0.0–5.0)
HEMATOCRIT: 43 % (ref 39.0–52.0)
Hemoglobin: 14.2 g/dL (ref 13.0–17.0)
LYMPHS ABS: 2.7 10*3/uL (ref 0.7–4.0)
Lymphocytes Relative: 34.4 % (ref 12.0–46.0)
MCHC: 33.1 g/dL (ref 30.0–36.0)
MCV: 87.9 fl (ref 78.0–100.0)
MONO ABS: 0.6 10*3/uL (ref 0.1–1.0)
Monocytes Relative: 8.2 % (ref 3.0–12.0)
Neutro Abs: 4.3 10*3/uL (ref 1.4–7.7)
Neutrophils Relative %: 54.4 % (ref 43.0–77.0)
PLATELETS: 196 10*3/uL (ref 150.0–400.0)
RBC: 4.89 Mil/uL (ref 4.22–5.81)
RDW: 12.9 % (ref 11.5–15.5)
WBC: 7.9 10*3/uL (ref 4.0–10.5)

## 2014-12-28 LAB — COMPREHENSIVE METABOLIC PANEL
ALT: 28 U/L (ref 0–53)
AST: 27 U/L (ref 0–37)
Albumin: 4.4 g/dL (ref 3.5–5.2)
Alkaline Phosphatase: 76 U/L (ref 39–117)
BUN: 28 mg/dL — ABNORMAL HIGH (ref 6–23)
CHLORIDE: 100 meq/L (ref 96–112)
CO2: 28 mEq/L (ref 19–32)
CREATININE: 0.78 mg/dL (ref 0.40–1.50)
Calcium: 9.7 mg/dL (ref 8.4–10.5)
GFR: 144.78 mL/min (ref >60.00)
Glucose, Bld: 129 mg/dL — ABNORMAL HIGH (ref 70–99)
Potassium: 4.3 mEq/L (ref 3.5–5.1)
SODIUM: 133 meq/L — AB (ref 135–145)
Total Bilirubin: 0.9 mg/dL (ref 0.2–1.2)
Total Protein: 7.7 g/dL (ref 6.0–8.3)

## 2014-12-28 LAB — HEMOGLOBIN A1C: HEMOGLOBIN A1C: 7 % — AB (ref 4.6–6.5)

## 2014-12-28 MED ORDER — METOPROLOL TARTRATE 50 MG PO TABS: 50.0000 mg | ORAL_TABLET | Freq: Two times a day (BID) | ORAL | Status: DC

## 2014-12-28 MED ORDER — LOSARTAN POTASSIUM-HCTZ 100-25 MG PO TABS: 1.0000 | ORAL_TABLET | Freq: Every day | ORAL | Status: DC

## 2014-12-28 MED ORDER — AMLODIPINE BESYLATE 10 MG PO TABS: 10.0000 mg | ORAL_TABLET | Freq: Every day | ORAL | Status: DC

## 2014-12-28 MED ORDER — GEMFIBROZIL 600 MG PO TABS: ORAL_TABLET | ORAL | Status: DC

## 2014-12-28 MED ORDER — METFORMIN HCL 1000 MG PO TABS: ORAL_TABLET | ORAL | Status: DC

## 2014-12-28 NOTE — Progress Notes (Signed)
Subjective:    Patient ID: Travis Scott, male    DOB: 19-Jun-1981, 34 y.o.   MRN: [id number]  HPI  34 yo pleasant man here for CPX and follow up of chronic medical conditions.   DM- diagnosed in 2004.  Referred him to endocrinology but he was lost to follow up.  Has not been to them in almost a year.  Lab Results  Component Value Date   HGBA1C 7.5* 04/14/2014   Currently taking Metformin 1000 mg twice daily and glyburide 1.25 mg daily, and Lantus 85 units qhs.   Checks his CBGs two times weekly, usually ranging around 100 fasting. Lab Results  Component Value Date   HGBA1C 7.5* 04/14/2014   Has been exercising and going to the gym- losing weight.  Feels good other than persistent episodes of hypoglycemia.  Neg urine micro. Due for eye exam.  Wt Readings from Last 3 Encounters:  12/28/14 247 lb 8 oz (112.265 kg)  04/15/14 256 lb 12 oz (116.461 kg)  11/16/13 260 lb (117.935 kg)    HLD-  Not quite at goal for a diabetic. He is taking lopid but not a statin. Lab Results  Component Value Date   CHOL 167 04/14/2014   HDL 41.60 04/14/2014   LDLCALC 114* 04/14/2014   LDLDIRECT 191.3 04/10/2011   TRIG 55.0 04/14/2014   CHOLHDL 4 04/14/2014   Wt Readings from Last 3 Encounters:  12/28/14 247 lb 8 oz (112.265 kg)  04/15/14 256 lb 12 oz (116.461 kg)  11/16/13 260 lb (117.935 kg)    HTN- diagnosed as a teenager. On Metoprolol 50 mg twice daily, losartan/hctz 100/25 mg daily, and amlodipine 10 mg daily.  No CP, SOB or blurred vision. Lab Results  Component Value Date   CREATININE 0.7 09/07/2013    No CP or SOB.  Patient Active Problem List   Diagnosis Date Noted  . Visit for well man health check 12/28/2014  . Obesity 04/15/2014  . Hypoglycemia associated with type 2 diabetes mellitus 04/15/2014  . Paronychia of third finger of right hand 11/16/2013  . Tobacco abuse 09/07/2013  . Diabetic neuropathy 08/14/2012  . Low testosterone 08/14/2012  . Erectile  dysfunction 08/14/2012  . Diabetes mellitus out of control 12/06/2011  . Fatigue 04/10/2011  . Hyperlipidemia 04/10/2011  . Hypertension    Past Medical History  Diagnosis Date  . Hypertension   . Diabetes mellitus    No past surgical history on file. History  Substance Use Topics  . Smoking status: Former Games developer  . Smokeless tobacco: Never Used  . Alcohol Use: No   Family History  Problem Relation Age of Onset  . Hypertension Mother   . Diabetes Mother   . Diabetes Father   . Hypertension Father    No Known Allergies   Current outpatient prescriptions:  .  amLODipine (NORVASC) 10 MG tablet, Take 1 tablet (10 mg total) by mouth daily., Disp: 90 tablet, Rfl: 2 .  gemfibrozil (LOPID) 600 MG tablet, TAKE 1 TABLET BY MOUTH TWICE DAILY ., Disp: 180 tablet, Rfl: 0 .  glyBURIDE (DIABETA) 1.25 MG tablet, TAKE 1 TABLET DAILY WITH BREAKFAST, Disp: 90 tablet, Rfl: 0 .  insulin glargine (LANTUS) 100 UNIT/ML injection, Inject 85 Units into the skin at bedtime., Disp: , Rfl:  .  losartan-hydrochlorothiazide (HYZAAR) 100-25 MG per tablet, TAKE 1 TABLET DAILY, Disp: 90 tablet, Rfl: 0 .  metFORMIN (GLUCOPHAGE) 1000 MG tablet, TAKE 1 TABLET BY MOUTH TWICE DAILY, Disp: 180 tablet, Rfl: 0 .  metoprolol (LOPRESSOR) 50 MG tablet, Take 1 tablet (50 mg total) by mouth 2 (two) times daily., Disp: 180 tablet, Rfl: 0  The PMH, PSH, Social History, Family History, Medications, and allergies have been reviewed in [facility], and have been updated if relevant.   Review of Systems Review of Systems  Constitutional: Negative.   HENT: Negative.   Eyes: Negative.   Respiratory: Negative.   Cardiovascular: Negative.   Gastrointestinal: Negative.   Endocrine: Negative.   Musculoskeletal: Negative.   Skin: Negative.   Allergic/Immunologic: Negative.   Neurological: Negative.   Hematological: Negative.   Psychiatric/Behavioral: Negative.   All other systems reviewed and are negative.      Objective:    Physical Exam BP 130/76 mmHg  Pulse 62  Temp(Src) 98 F (36.7 C) (Oral)  Ht  (1.803 m)  Wt 247 lb 8 oz (112.265 kg)  BMI 34.53 kg/m2  SpO2 99% General:  overweght male in NAD Eyes:  PERRL Ears:  External ear exam shows no significant lesions or deformities.  Otoscopic examination reveals clear canals, tympanic membranes are intact bilaterally without bulging, retraction, inflammation or discharge. Hearing is grossly normal bilaterally. Nose:  External nasal examination shows no deformity or inflammation. Nasal mucosa are pink and moist without lesions or exudates. Neck:  no carotid bruit or thyromegaly no cervical or supraclavicular lymphadenopathy  Lungs:  Normal respiratory effort, chest expands symmetrically. Lungs are clear to auscultation, no crackles or wheezes. Heart:  Normal rate and regular rhythm. S1 and S2 normal without gallop, murmur, click, rub or other extra sounds. Abdomen:  Bowel sounds positive,abdomen soft and non-tender without masses, organomegaly or hernias noted. Pulses:  R and L posterior tibial pulses are full and equal bilaterally  Extremities:  no edema         Assessment & Plan:

## 2014-12-28 NOTE — Patient Instructions (Signed)
Good to see you. Please stop taking your glyburide.  Great job taking yourself!

## 2014-12-28 NOTE — Addendum Note (Signed)
Addended by: Desmond Dike on: 12/28/2014 12:50 PM   Modules accepted: Orders

## 2014-12-28 NOTE — Assessment & Plan Note (Signed)
At goal for a diabetic. No changes made to antihypertensives today.

## 2014-12-28 NOTE — Progress Notes (Signed)
Pre visit review using our clinic review tool, if applicable. No additional management support is needed unless otherwise documented below in the visit note. 

## 2014-12-28 NOTE — Assessment & Plan Note (Signed)
Now with hypoglycemia.  D/c glyburide. Check labs today. Pneumococcal vaccine today. Due for eye exam- per pt- wife is setting this up. Continue exercising- encouraged and congratulated his life style changes. On ARB.

## 2014-12-28 NOTE — Assessment & Plan Note (Signed)
Reviewed preventive care protocols, scheduled due services, and updated immunizations Discussed nutrition, exercise, diet, and healthy lifestyle.  Orders Placed This Encounter  Procedures  . CBC with Differential/Platelet  . Comprehensive metabolic panel  . Lipid panel  . Hemoglobin A1c  . Microalbumin / creatinine urine ratio  . HIV antibody (with reflex)

## 2014-12-29 ENCOUNTER — Encounter: Payer: Self-pay | Admitting: *Deleted

## 2014-12-29 LAB — HIV ANTIBODY (ROUTINE TESTING W REFLEX): HIV 1&2 Ab, 4th Generation: NONREACTIVE

## 2015-03-12 ENCOUNTER — Encounter: Payer: Self-pay | Admitting: Emergency Medicine

## 2015-03-12 ENCOUNTER — Emergency Department
Admission: EM | Admit: 2015-03-12 | Discharge: 2015-03-12 | Disposition: A | Discharge status: Home / Self Care | Payer: PRIVATE HEALTH INSURANCE | Attending: Emergency Medicine | Admitting: Emergency Medicine

## 2015-03-12 DIAGNOSIS — I10 Essential (primary) hypertension: Secondary | ICD-10-CM | POA: Insufficient documentation

## 2015-03-12 DIAGNOSIS — Z794 Long term (current) use of insulin: Secondary | ICD-10-CM | POA: Insufficient documentation

## 2015-03-12 DIAGNOSIS — E1140 Type 2 diabetes mellitus with diabetic neuropathy, unspecified: Secondary | ICD-10-CM | POA: Diagnosis not present

## 2015-03-12 DIAGNOSIS — Z79899 Other long term (current) drug therapy: Secondary | ICD-10-CM | POA: Insufficient documentation

## 2015-03-12 DIAGNOSIS — M79651 Pain in right thigh: Secondary | ICD-10-CM

## 2015-03-12 DIAGNOSIS — E119 Type 2 diabetes mellitus without complications: Secondary | ICD-10-CM | POA: Insufficient documentation

## 2015-03-12 DIAGNOSIS — Z87891 Personal history of nicotine dependence: Secondary | ICD-10-CM | POA: Diagnosis not present

## 2015-03-12 DIAGNOSIS — M25551 Pain in right hip: Secondary | ICD-10-CM | POA: Diagnosis present

## 2015-03-12 MED ORDER — MELOXICAM 15 MG PO TABS: 15.0000 mg | ORAL_TABLET | Freq: Every day | ORAL | Status: DC

## 2015-03-12 MED ORDER — TIZANIDINE HCL 4 MG PO TABS: 4.0000 mg | ORAL_TABLET | Freq: Three times a day (TID) | ORAL | Status: AC

## 2015-03-12 NOTE — ED Notes (Signed)
Sharp pain going down right hip.  Ambulates well, skin w/d.

## 2015-03-12 NOTE — ED Provider Notes (Signed)
[facility] Emergency Department Provider Note  ____________________________________________  Time seen: Approximately 8:15 AM  I have reviewed the triage vital signs and the nursing notes.   HISTORY  Chief Complaint Hip Pain   HPI Travis Scott is a 34 y.o. male who presents to the emergency department for evaluation of right hip pain. He states that the pain started suddenly a couple days ago. He denies injury. He states that he does have a bulging disc and degenerative disc disease in his lower back, but that has not been bothering him lately. He has never had similar symptoms in this area.    Past Medical History  Diagnosis Date  . Hypertension   . Diabetes mellitus     Patient Active Problem List   Diagnosis Date Noted  . Visit for well man health check 12/28/2014  . Obesity 04/15/2014  . Hypoglycemia associated with type 2 diabetes mellitus 04/15/2014  . Paronychia of third finger of right hand 11/16/2013  . Tobacco abuse 09/07/2013  . Diabetic neuropathy 08/14/2012  . Low testosterone 08/14/2012  . Erectile dysfunction 08/14/2012  . Diabetes mellitus out of control 12/06/2011  . Fatigue 04/10/2011  . Hyperlipidemia 04/10/2011  . Hypertension     History reviewed. No pertinent past surgical history.  Current Outpatient Rx  Name  Route  Sig  Dispense  Refill  . amLODipine (NORVASC) 10 MG tablet   Oral   Take 1 tablet (10 mg total) by mouth daily.   90 tablet   3   . gemfibrozil (LOPID) 600 MG tablet      TAKE 1 TABLET BY MOUTH TWICE DAILY .   180 tablet   1   . insulin glargine (LANTUS) 100 UNIT/ML injection   Subcutaneous   Inject 85 Units into the skin at bedtime.         Marland Kitchen losartan-hydrochlorothiazide (HYZAAR) 100-25 MG per tablet   Oral   Take 1 tablet by mouth daily.   90 tablet   3   . meloxicam (MOBIC) 15 MG tablet   Oral   Take 1 tablet (15 mg total) by mouth daily.   30 tablet   0   . metFORMIN  (GLUCOPHAGE) 1000 MG tablet      TAKE 1 TABLET BY MOUTH TWICE DAILY   180 tablet   1   . metoprolol (LOPRESSOR) 50 MG tablet   Oral   Take 1 tablet (50 mg total) by mouth 2 (two) times daily.   180 tablet   3   . tiZANidine (ZANAFLEX) 4 MG tablet   Oral   Take 1 tablet (4 mg total) by mouth 3 (three) times daily.   90 tablet   0     Allergies Review of patient's allergies indicates no known allergies.  Family History  Problem Relation Age of Onset  . Hypertension Mother   . Diabetes Mother   . Diabetes Father   . Hypertension Father     Social History Social History  Substance Use Topics  . Smoking status: Former Games developer  . Smokeless tobacco: Never Used  . Alcohol Use: No    Review of Systems Constitutional: No recent illness. Eyes: No visual changes. ENT: No sore throat. Cardiovascular: Denies chest pain or palpitations. Respiratory: Denies shortness of breath. Gastrointestinal: No abdominal pain.  Genitourinary: Negative for dysuria. Musculoskeletal: Pain in right hip with radiation into the leg Skin: Negative for rash. Neurological: Negative for headaches, focal weakness or numbness. 10-point ROS otherwise negative.  ____________________________________________   PHYSICAL EXAM:  VITAL SIGNS: ED Triage Vitals  Enc Vitals Group     BP 03/12/15 0748 153/90 mmHg     Pulse Rate 03/12/15 0748 77     Resp 03/12/15 0748 18     Temp 03/12/15 0748 98.1 F (36.7 C)     Temp Source 03/12/15 0748 Oral     SpO2 03/12/15 0748 99 %     Weight 03/12/15 0748 235 lb (106.595 kg)     Height 03/12/15 0748 6' (1.829 m)     Head Cir --      Peak Flow --      Pain Score 03/12/15 0737 8     Pain Loc --      Pain Edu? --      Excl. in GC? --     Constitutional: Alert and oriented. Well appearing and in no acute distress. Eyes: Conjunctivae are normal. EOMI. Head: Atraumatic. Nose: No congestion/rhinnorhea. Neck: No stridor.  Respiratory: Normal respiratory  effort.   Musculoskeletal: Right lateral hip/thigh pain. Neurologic:  Normal speech and language. No gross focal neurologic deficits are appreciated. Speech is normal. No gait instability. Skin:  Skin is warm, dry and intact. Atraumatic. Psychiatric: Mood and affect are normal. Speech and behavior are normal.  ____________________________________________   LABS (all labs ordered are listed, but only abnormal results are displayed)  Labs Reviewed - No data to display ____________________________________________  RADIOLOGY  Not indicated ____________________________________________   PROCEDURES  Procedure(s) performed: None   ____________________________________________   INITIAL IMPRESSION / ASSESSMENT AND PLAN / ED COURSE  Pertinent labs & imaging results that were available during my care of the patient were reviewed by me and considered in my medical decision making (see chart for details).  Patient was advised to follow-up with his primary care provider for symptoms that don't improve with medication and time. He was advised to return to the emergency department for symptoms that change or worsen if he is unable schedule an appointment. ____________________________________________   FINAL CLINICAL IMPRESSION(S) / ED DIAGNOSES  Final diagnoses:  Acute thigh pain, right       Chinita Pester, FNP 03/12/15 1610  Governor Rooks, MD 03/12/15 1359

## 2015-06-30 ENCOUNTER — Ambulatory Visit: Payer: PRIVATE HEALTH INSURANCE | Admitting: Family Medicine

## 2015-08-09 ENCOUNTER — Ambulatory Visit
Admission: EM | Admit: 2015-08-09 | Discharge: 2015-08-09 | Disposition: A | Discharge status: Home / Self Care | Payer: PRIVATE HEALTH INSURANCE | Attending: Family Medicine | Admitting: Family Medicine

## 2015-08-09 DIAGNOSIS — J069 Acute upper respiratory infection, unspecified: Secondary | ICD-10-CM

## 2015-08-09 HISTORY — DX: Calculus of kidney: N20.0

## 2015-08-09 HISTORY — DX: Other chronic pain: G89.29

## 2015-08-09 HISTORY — DX: Dorsalgia, unspecified: M54.9

## 2015-08-09 LAB — RAPID STREP SCREEN (MED CTR MEBANE ONLY): STREPTOCOCCUS, GROUP A SCREEN (DIRECT): NEGATIVE

## 2015-08-09 MED ORDER — FLUTICASONE PROPIONATE 50 MCG/ACT NA SUSP: 2.0000 | Freq: Every day | NASAL | Status: DC

## 2015-08-09 MED ORDER — HYDROCOD POLST-CPM POLST ER 10-8 MG/5ML PO SUER: 5.0000 mL | Freq: Two times a day (BID) | ORAL | Status: DC

## 2015-08-09 NOTE — ED Notes (Signed)
Started last Thursday with c/o sore throat. "I lost my voice for 2 days". Started to improve, but symptoms began worsening Sunday night

## 2015-08-09 NOTE — Discharge Instructions (Signed)
Cool Mist Vaporizers °Vaporizers may help relieve the symptoms of a cough and cold. They add moisture to the air, which helps mucus to become thinner and less sticky. This makes it easier to breathe and cough up secretions. Cool mist vaporizers do not cause serious burns like hot mist vaporizers, which may also be called steamers or humidifiers. Vaporizers have not been proven to help with colds. You should not use a vaporizer if you are allergic to mold. °HOME CARE INSTRUCTIONS °· Follow the package instructions for the vaporizer. °· Do not use anything other than distilled water in the vaporizer. °· Do not run the vaporizer all of the time. This can cause mold or bacteria to grow in the vaporizer. °· Clean the vaporizer after each time it is used. °· Clean and dry the vaporizer well before storing it. °· Stop using the vaporizer if worsening respiratory symptoms develop. °  °This information is not intended to replace advice given to you by your health care provider. Make sure you discuss any questions you have with your health care provider. °  °Document Released: 03/29/2004 Document Revised: 07/07/2013 Document Reviewed: 11/19/2012 °Elsevier Interactive Patient Education ©2016 Else[employer] Inc[employer] ° °Upper Respiratory Infection, Adult °Most upper respiratory infections (URIs) are a viral infection of the air passages leading to the lungs. A URI affects the nose, throat, and upper air passages. The most common type of URI is nasopharyngitis and is typically referred to as "the common cold." °URIs run their course and usually go away on their own. Most of the time, a URI does not require medical attention, but sometimes a bacterial infection in the upper airways can follow a viral infection. This is called a secondary infection. Sinus and middle ear infections are common types of secondary upper respiratory infections. °Bacterial pneumonia can also complicate a URI. A URI can worsen asthma and chronic obstructive  pulmonary disease (COPD). Sometimes, these complications can require emergency medical care and may be life threatening.  °CAUSES °Almost all URIs are caused by viruses. A virus is a type of germ and can spread from one person to another.  °RISKS FACTORS °You may be at risk for a URI if:  °· You smoke.   °· You have chronic heart or lung disease. °· You have a weakened defense (immune) system.   °· You are very young or very old.   °· You have nasal allergies or asthma. °· You work in crowded or poorly ventilated areas. °· You work in health care facilities or schools. °SIGNS AND SYMPTOMS  °Symptoms typically develop 2-3 days after you come in contact with a cold virus. Most viral URIs last 7-10 days. However, viral URIs from the influenza virus (flu virus) can last 14-18 days and are typically more severe. Symptoms may include:  °· Runny or stuffy (congested) nose.   °· Sneezing.   °· Cough.   °· Sore throat.   °· Headache.   °· Fatigue.   °· Fever.   °· Loss of appetite.   °· Pain in your forehead, behind your eyes, and over your cheekbones (sinus pain). °· Muscle aches.   °DIAGNOSIS  °Your health care provider may diagnose a URI by: °· Physical exam. °· Tests to check that your symptoms are not due to another condition such as: °¨ Strep throat. °¨ Sinusitis. °¨ Pneumonia. °¨ Asthma. °TREATMENT  °A URI goes away on its own with time. It cannot be cured with medicines, but medicines may be prescribed or recommended to relieve symptoms. Medicines may help: °· Reduce your fever. °· Reduce   your cough. °· Relieve nasal congestion. °HOME CARE INSTRUCTIONS  °· Take medicines only as directed by your health care provider.   °· Gargle warm saltwater or take cough drops to comfort your throat as directed by your health care provider. °· Use a warm mist humidifier or inhale steam from a shower to increase air moisture. This may make it easier to breathe. °· Drink enough fluid to keep your urine clear or pale yellow.   °· Eat  soups and other clear broths and maintain good nutrition.   °· Rest as needed.   °· Return to work when your temperature has returned to normal or as your health care provider advises. You may need to stay home longer to avoid infecting others. You can also use a face mask and careful hand washing to prevent spread of the virus. °· Increase the usage of your inhaler if you have asthma.   °· Do not use any tobacco products, including cigarettes, chewing tobacco, or electronic cigarettes. If you need help quitting, ask your health care provider. °PREVENTION  °The best way to protect yourself from getting a cold is to practice good hygiene.  °· Avoid oral or hand contact with people with cold symptoms.   °· Wash your hands often if contact occurs.   °There is no clear evidence that vitamin C, vitamin E, echinacea, or exercise reduces the chance of developing a cold. However, it is always recommended to get plenty of rest, exercise, and practice good nutrition.  °SEEK MEDICAL CARE IF:  °· You are getting worse rather than better.   °· Your symptoms are not controlled by medicine.   °· You have chills. °· You have worsening shortness of breath. °· You have brown or red mucus. °· You have yellow or brown nasal discharge. °· You have pain in your face, especially when you bend forward. °· You have a fever. °· You have swollen neck glands. °· You have pain while swallowing. °· You have white areas in the back of your throat. °SEEK IMMEDIATE MEDICAL CARE IF:  °· You have severe or persistent: °¨ Headache. °¨ Ear pain. °¨ Sinus pain. °¨ Chest pain. °· You have chronic lung disease and any of the following: °¨ Wheezing. °¨ Prolonged cough. °¨ Coughing up blood. °¨ A change in your usual mucus. °· You have a stiff neck. °· You have changes in your: °¨ Vision. °¨ Hearing. °¨ Thinking. °¨ Mood. °MAKE SURE YOU:  °· Understand these instructions. °· Will watch your condition. °· Will get help right away if you are not doing well or  get worse. °  °This information is not intended to replace advice given to you by your health care provider. Make sure you discuss any questions you have with your health care provider. °  °Document Released: 12/26/2000 Document Revised: 11/16/2014 Document Reviewed: 10/07/2013 °Elsevier Interactive Patient Education ©2016 Elsevier Inc. ° °

## 2015-08-09 NOTE — ED Provider Notes (Signed)
CSN: [id number]     Arrival date & time 08/09/15  1210 History   First MD Initiated Contact with Patient 08/09/15 1336     Chief Complaint  Patient presents with  . Sore Throat   (Consider location/radiation/quality/duration/timing/severity/associated sxs/prior Treatment) HPI   35 year old male presents with a sore throat Perlie Mayo productive cough of yellow sputum. He states that this started about 5 days ago. In any fever or chills. Sputum has been yellowish to brown at times. Been using over-the-counter medications as well as some low salt water gargles which have not been fully effective.  Past Medical History  Diagnosis Date  . Hypertension   . Diabetes mellitus   . Chronic back pain   . Kidney stones    History reviewed. No pertinent past surgical history. Family History  Problem Relation Age of Onset  . Hypertension Mother   . Diabetes Mother   . Diabetes Father   . Hypertension Father    Social History  Substance Use Topics  . Smoking status: Former Games developer  . Smokeless tobacco: Never Used  . Alcohol Use: No    Review of Systems  Constitutional: Negative for fever, chills, diaphoresis, activity change and fatigue.  HENT: Positive for congestion, postnasal drip, rhinorrhea, sinus pressure, sneezing, sore throat and voice change.   Respiratory: Positive for cough. Negative for shortness of breath, wheezing and stridor.   All other systems reviewed and are negative.   Allergies  Review of patient's allergies indicates no known allergies.  Home Medications   Prior to Admission medications   Medication Sig Start Date End Date Taking? Authorizing Provider  amLODipine (NORVASC) 10 MG tablet Take 1 tablet (10 mg total) by mouth daily. 12/28/14  Yes Dianne Dun, MD  gemfibrozil (LOPID) 600 MG tablet TAKE 1 TABLET BY MOUTH TWICE DAILY . 12/28/14  Yes Dianne Dun, MD  glyBURIDE (DIABETA) 1.25 MG tablet Take 1.25 mg by mouth daily with breakfast.   Yes Historical  Provider, MD  insulin glargine (LANTUS) 100 UNIT/ML injection Inject 85 Units into the skin at bedtime. 04/22/12  Yes Dianne Dun, MD  losartan-hydrochlorothiazide (HYZAAR) 100-25 MG per tablet Take 1 tablet by mouth daily. 12/28/14  Yes Dianne Dun, MD  metFORMIN (GLUCOPHAGE) 1000 MG tablet TAKE 1 TABLET BY MOUTH TWICE DAILY 12/28/14  Yes Dianne Dun, MD  metoprolol (LOPRESSOR) 50 MG tablet Take 1 tablet (50 mg total) by mouth 2 (two) times daily. 12/28/14  Yes Dianne Dun, MD  chlorpheniramine-HYDROcodone (TUSSIONEX PENNKINETIC ER) 10-8 MG/5ML SUER Take 5 mLs by mouth 2 (two) times daily. 08/09/15   Lutricia Feil, PA-C  fluticasone (FLONASE) 50 MCG/ACT nasal spray Place 2 sprays into both nostrils daily. 08/09/15   Lutricia Feil, PA-C  meloxicam (MOBIC) 15 MG tablet Take 1 tablet (15 mg total) by mouth daily. 03/12/15   Chinita Pester, FNP  tiZANidine (ZANAFLEX) 4 MG tablet Take 1 tablet (4 mg total) by mouth 3 (three) times daily. 03/12/15 03/11/16  Chinita Pester, FNP   Meds Ordered and Administered this Visit  Medications - No data to display  BP 151/85 mmHg  Pulse 70  Temp(Src) 96.7 F (35.9 C) (Tympanic)  Resp 16  Ht 6' (1.829 m)  Wt 245 lb (111.131 kg)  BMI 33.22 kg/m2  SpO2 100% No data found.   Physical Exam  Constitutional: He is oriented to person, place, and time. He appears well-developed and well-nourished. No distress.  HENT:  Head: Normocephalic  and atraumatic.  Right Ear: External ear normal.  Left Ear: External ear normal.  Nose: Nose normal.  Mouth/Throat: Oropharynx is clear and moist. No oropharyngeal exudate.  Eyes: Conjunctivae are normal. Pupils are equal, round, and reactive to light. Right eye exhibits no discharge. Left eye exhibits no discharge.  Neck: Normal range of motion. Neck supple.  Pulmonary/Chest: Effort normal and breath sounds normal. No stridor. No respiratory distress. He has no wheezes. He has no rales.  Musculoskeletal: Normal range  of motion. He exhibits no edema or tenderness.  Lymphadenopathy:    He has no cervical adenopathy.  Neurological: He is alert and oriented to person, place, and time.  Skin: Skin is warm and dry. He is not diaphoretic.  Psychiatric: He has a normal mood and affect. His behavior is normal. Judgment and thought content normal.  Nursing note and vitals reviewed.   ED Course  Procedures (including critical care time)  Labs Review Labs Reviewed  RAPID STREP SCREEN (NOT AT [facility])  CULTURE, GROUP A STREP [facility])    Imaging Review No results found.   Visual Acuity Review  Right Eye Distance:   Left Eye Distance:   Bilateral Distance:    Right Eye Near:   Left Eye Near:    Bilateral Near:         MDM   1. URI, acute    Discharge Medication List as of 08/09/2015  1:54 PM    START taking these medications   Details  chlorpheniramine-HYDROcodone (TUSSIONEX PENNKINETIC ER) 10-8 MG/5ML SUER Take 5 mLs by mouth 2 (two) times daily., Starting 08/09/2015, Until Discontinued, Print    fluticasone (FLONASE) 50 MCG/ACT nasal spray Place 2 sprays into both nostrils daily., Starting 08/09/2015, Until Discontinued, Print      Plan: 1. Test/x-ray results and diagnosis reviewed with patient 2. rx as per orders; risks, benefits, potential side effects reviewed with patient 3. Recommend supportive treatment with rest and fluids. I recommended that he did cut the salt water gargles. I told him that this most likely is a viral process. But he will call in 48 hours for results of the tests of the throat swab. Otherwise it has to run its course. 4. F/u prn if symptoms worsen or don't improve     Lutricia Feil, PA-C 08/09/15 1404

## 2015-08-11 ENCOUNTER — Telehealth (HOSPITAL_COMMUNITY): Payer: Self-pay | Admitting: Internal Medicine

## 2015-08-11 DIAGNOSIS — J020 Streptococcal pharyngitis: Secondary | ICD-10-CM

## 2015-08-11 LAB — CULTURE, GROUP A STREP (THRC)

## 2015-08-11 MED ORDER — PENICILLIN V POTASSIUM 500 MG PO TABS: 500.0000 mg | ORAL_TABLET | Freq: Two times a day (BID) | ORAL | Status: DC

## 2015-08-11 NOTE — ED Notes (Signed)
Throat culture growing strep group C.  Rx penicillin sent to [facility] on [address].  Please let patient know.  LM  Eustace Moore, MD 08/11/15 1438

## 2015-08-12 NOTE — Telephone Encounter (Signed)
Duplicate encounter

## 2015-08-18 IMAGING — CT CT ORBITS WITHOUT CONTRAST
4 of 9 series · 11 of 30 positions shown, 12 images · non-contrast
Comparison: none

REASON FOR EXAM: attn LT Eustachian Tube  ear fullness hearing changes
COMMENTS:

[Series 6: innerearseq · axial · 0.30mm/px · z∈[-151,-106]mm · 3 of 152 slices shown, 4 images]
[im 38/152  brain]
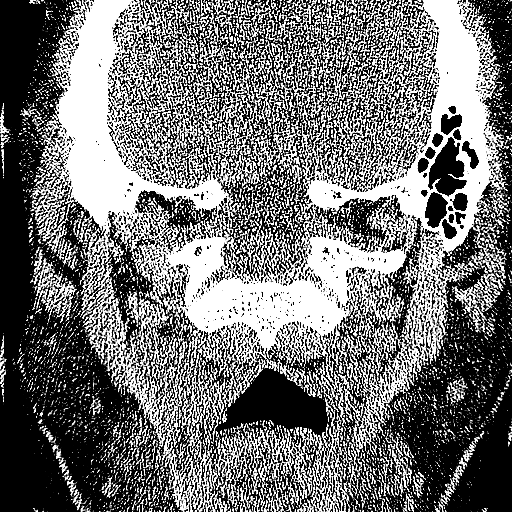
[im 38/152  bone]
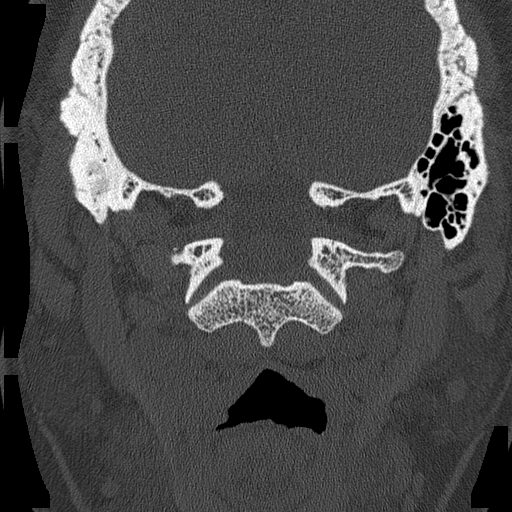
[im 76/152  bone]
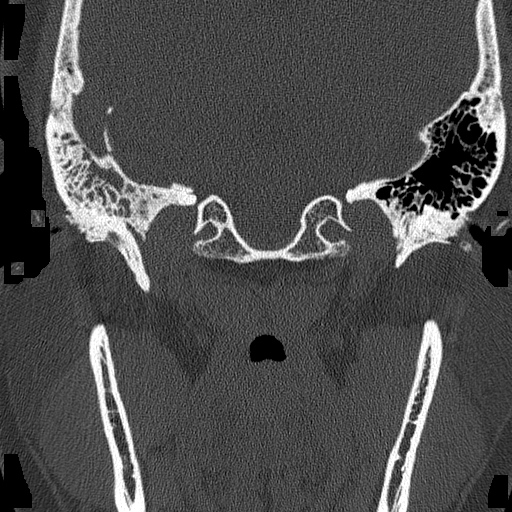
[im 114/152  bone]
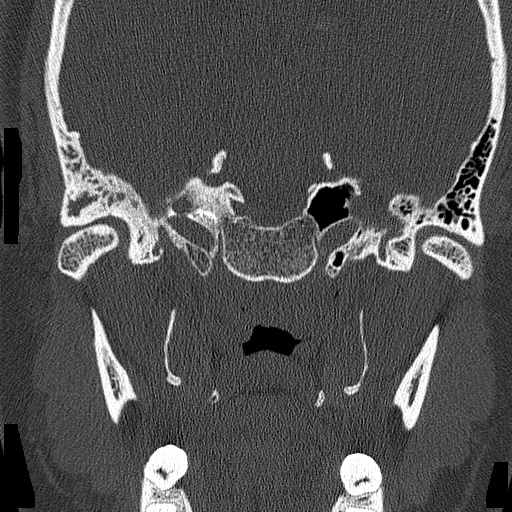

[Series 8: innerearseq right prone · axial · 0.21mm/px · z∈[-152,-106]mm · 3 of 152 slices shown]
[im 38/152  bone]
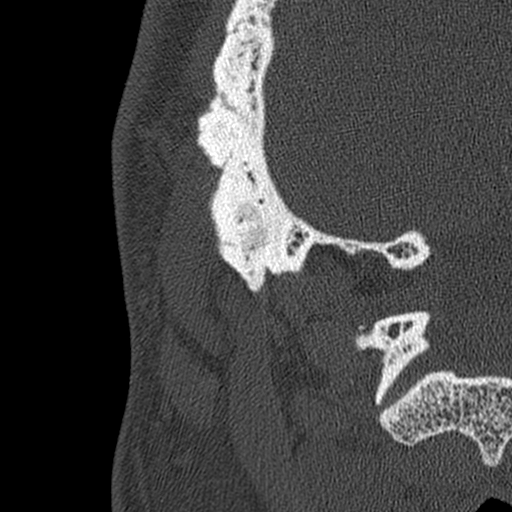
[im 76/152  bone]
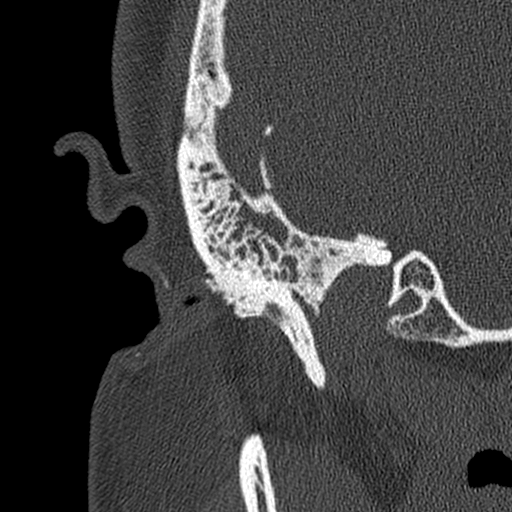
[im 114/152  bone]
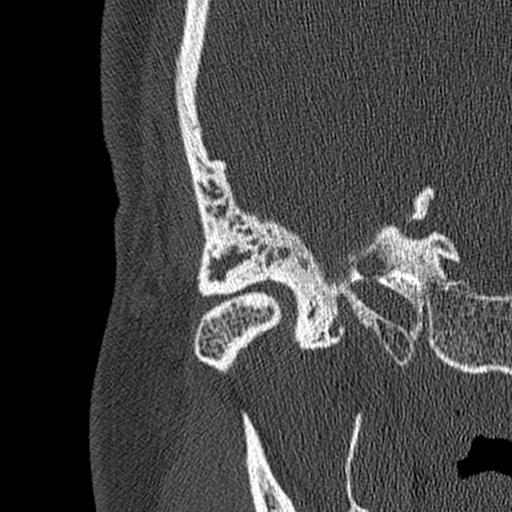

[Series 9: innerearseq left prone · axial · 0.21mm/px · z∈[-152,-106]mm · 3 of 152 slices shown]
[im 38/152  bone]
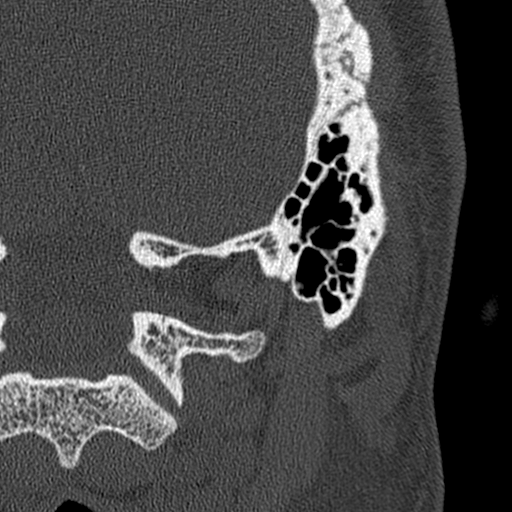
[im 76/152  bone]
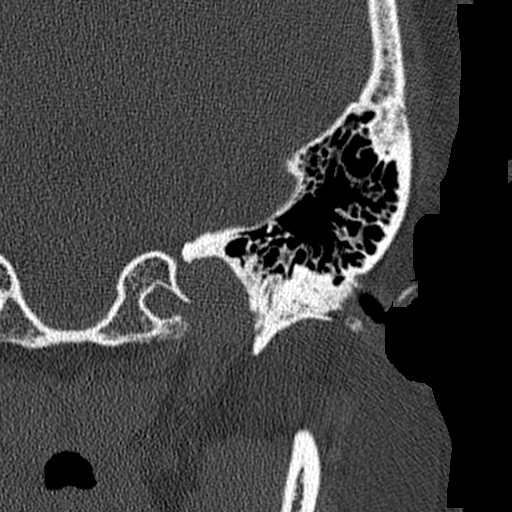
[im 114/152  bone]
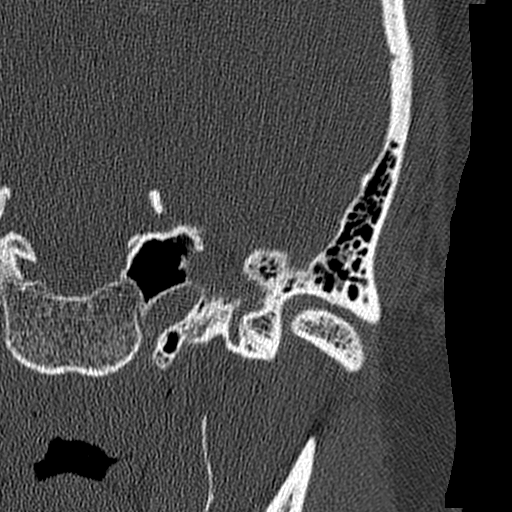

[Series 15: innerearspiral supine · axial · 0.40mm/px · z∈[-112,-89]mm · 2 of 114 slices shown]
[im 38/114  bone]
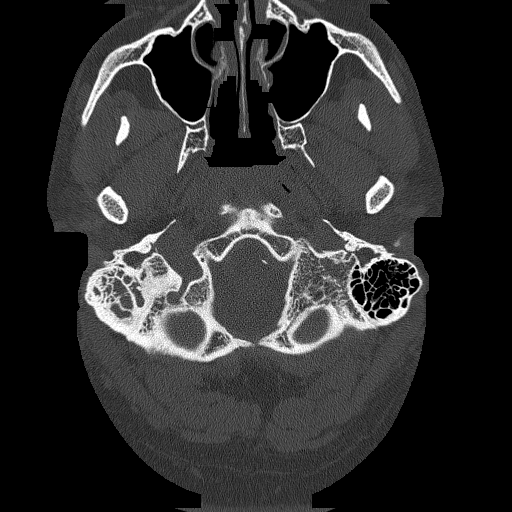
[im 76/114  bone]
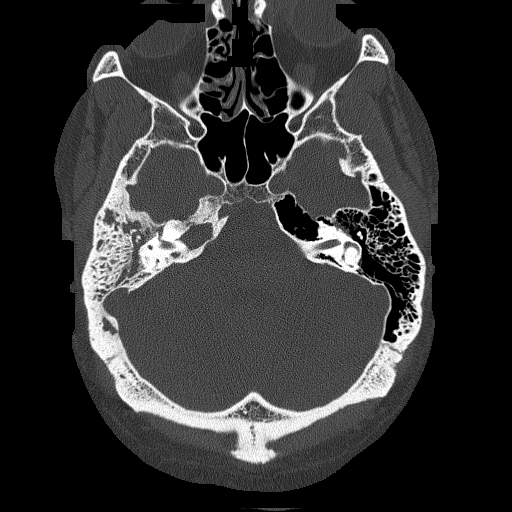

[11 of 30 positions shown; findings below may reference images not displayed]

PROCEDURE:     TYRKISK - BENRABAH ETOIL OR TEMPORAL BONE WO  - May 13, 2013  [DATE]

RESULT:     Multislice helical acquisition through the temporal bones is
performed in direct axial and coronal positions with images reconstructed at
6 mm slice thickness in the axial plane. There is no previous similar study
for comparison.

There is complete opacification of the mastoid air cells and of the middle
ear. The external auditory canal appears to be grossly normal. The
nasopharynx near the eustachian tube orifice shows no evidence of a mass on
either side. There is no evidence of definite inflammatory change in the
nasopharynx. The bilateral cochlear structures, semicircular canals,
vestibule and internal auditory canals are normal in morphology. The
tympanic membrane on the right is not well seen. The facial nerve canal is
normal in caliber bilaterally. The ossicles appear to be intact bilaterally.
IMPRESSION: 1. No definite mass or inflammatory change in the nasopharynx near the
eustachian tube orifice is.
2. Marked fluid in the right middle ear cavity which may represent chronic
otitis media with significant opacification of the right Minhas apex and
mastoid regions which was seen on the previous head CT. Underlying
cholesteatoma is not excluded but there is no definite bony destruction.
Correlate clinically. The left middle ear, mastoid and Minhas apex appear
unremarkable.

[REDACTED]

## 2015-09-12 ENCOUNTER — Ambulatory Visit
Admission: EM | Admit: 2015-09-12 | Discharge: 2015-09-12 | Disposition: A | Discharge status: Home / Self Care | Payer: PRIVATE HEALTH INSURANCE | Attending: Family Medicine | Admitting: Family Medicine

## 2015-09-12 ENCOUNTER — Encounter: Payer: Self-pay | Admitting: Emergency Medicine

## 2015-09-12 DIAGNOSIS — R6889 Other general symptoms and signs: Secondary | ICD-10-CM | POA: Diagnosis not present

## 2015-09-12 LAB — RAPID INFLUENZA A&B ANTIGENS
Influenza A (ARMC): NOT DETECTED
Influenza B (ARMC): NOT DETECTED

## 2015-09-12 MED ORDER — FEXOFENADINE-PSEUDOEPHED ER 180-240 MG PO TB24: 1.0000 | ORAL_TABLET | Freq: Every day | ORAL | Status: DC

## 2015-09-12 MED ORDER — HYDROCOD POLST-CPM POLST ER 10-8 MG/5ML PO SUER: 5.0000 mL | Freq: Two times a day (BID) | ORAL | Status: DC | PRN

## 2015-09-12 MED ORDER — OSELTAMIVIR PHOSPHATE 75 MG PO CAPS: 75.0000 mg | ORAL_CAPSULE | Freq: Two times a day (BID) | ORAL | Status: DC

## 2015-09-12 NOTE — ED Notes (Signed)
Patient c/o cough, fever, and bodyaches since Friday.

## 2015-09-12 NOTE — Discharge Instructions (Signed)
Influenza, Adult °Influenza (flu) is an infection in the mouth, nose, and throat (respiratory tract) caused by a virus. The flu can make you feel very ill. Influenza spreads easily from person to person (contagious).  °HOME CARE  °· Only take medicines as told by your doctor. °· Use a cool mist humidifier to make breathing easier. °· Get plenty of rest until your fever goes away. This usually takes 3 to 4 days. °· Drink enough fluids to keep your pee (urine) clear or pale yellow. °· Cover your mouth and nose when you cough or sneeze. °· Wash your hands well to avoid spreading the flu. °· Stay home from work or school until your fever has been gone for at least 1 full day. °· Get a flu shot every year. °GET HELP RIGHT AWAY IF:  °· You have trouble breathing or feel short of breath. °· Your skin or nails turn blue. °· You have severe neck pain or stiffness. °· You have a severe headache, facial pain, or earache. °· Your fever gets worse or keeps coming back. °· You feel sick to your stomach (nauseous), throw up (vomit), or have watery poop (diarrhea). °· You have chest pain. °· You have a deep cough that gets worse, or you cough up more thick spit (mucus). °MAKE SURE YOU:  °· Understand these instructions. °· Will watch your condition. °· Will get help right away if you are not doing well or get worse. °  °This information is not intended to replace advice given to you by your health care provider. Make sure you discuss any questions you have with your health care provider. °  °Document Released: 04/10/2008 Document Revised: 07/23/2014 Document Reviewed: 10/01/2011 °Elsevier Interactive Patient Education ©2016 Else[employer] Inc. °Influenza Tests °WHY AM I HAVING THIS TEST? °You may have an influenza test to help your health care provider determine what type of respiratory infection you have. The test may also be used to help determine a treatment plan and to monitor influenza activity within a community. °There are two  types of influenza virus: types A and B. Often, one strain of type A influenza will be the most common type of influenza in a community during flu season. This is typically between the months of October and May. Influenza tests can help determine which strain of influenza type A is occurring most often in the community. °WHAT KIND OF SAMPLE IS TAKEN? °Influenza tests are performed by collecting a small sample of fluids (secretions) from your nose or throat using a cotton swab. Tests performed on nasal secretions are more accurate than tests performed on a sample taken from your throat. °· Rapid influenza tests are available and have become the most frequently used tests for influenza. They are most accurate when completed within the first 48 hours after your symptoms begin. °· Depending on the method, a rapid influenza test may be completed in your health care provider's office in less than 30 minutes. It can also be sent to a lab with the results available the same day. °· Depending on the particular type of test used, it can identify influenza type A, a mixture of types A and B, or differentiate between type A and B. °· Another test that your health care provider may order is a viral culture. This also requires the collection of secretions from your nose or throat. The sample is then sent to a lab for processing. This may take several days to complete. °HOW ARE YOUR TEST RESULTS   REPORTED? °Your test results will be reported as either positive or negative. A false-negative result can occur. A false-negative result is incorrect because it indicates a condition or finding is not present when it is. °It is your responsibility to obtain your test results. Ask the lab or department performing the test when and how you will get your results. °WHAT DO THE RESULTS MEAN? °· A positive test means you have influenza. Tests may further determine the type of influenza you have. °· A negative influenza test result means it is  not likely that you have influenza. °· A false-negative result can occur. False-negative results are more likely to happen at the height of the influenza season. °Talk with your health care provider to discuss your results, treatment options, and if necessary, the need for more tests. Talk with your health care provider if you have any questions about your results. °  °This information is not intended to replace advice given to you by your health care provider. Make sure you discuss any questions you have with your health care provider. °  °Document Released: 04/11/2005 Document Revised: 07/23/2014 Document Reviewed: 11/18/2013 °Elsevier Interactive Patient Education ©2016 [employer] Inc. ° °

## 2015-09-12 NOTE — ED Provider Notes (Signed)
CSN: [id number]     Arrival date & time 09/12/15  1111 History   First MD Initiated Contact with Patient 09/12/15 1259    Nurses notes were reviewed. Chief Complaint  Patient presents with  . Cough  . Fever  . Generalized Body Aches    Patient with him some mild symptoms Friday but nothing that he was to concern about the Saturdays when it really hit him as far as nasal congestion coughing running nose and overall malaise. He stated that he took some TheraFlu followed very Sunday but by Sunday night came back with him again this morning. He has had a flu shot does not smoke it is of hypertension diabetes and history kidney stones. Mother has had hypertension diabetes and follows also said diabetes. No known drug allergies.   (Consider location/radiation/quality/duration/timing/severity/associated sxs/prior Treatment) Patient is a 35 y.o. male presenting with cough and fever. The history is provided by the patient. No language interpreter was used.  Cough Cough characteristics:  Productive Sputum characteristics:  Nondescript Severity:  Moderate Progression:  Worsening Chronicity:  New Context: upper respiratory infection   Relieved by:  Cough suppressants Ineffective treatments:  Decongestant and cough suppressants Associated symptoms: fever, myalgias and rhinorrhea   Associated symptoms: no chest pain, no diaphoresis, no ear pain, no rash, no shortness of breath and no sinus congestion   Risk factors: no recent infection   Fever Associated symptoms: cough, myalgias and rhinorrhea   Associated symptoms: no chest pain, no ear pain and no rash     Past Medical History  Diagnosis Date  . Hypertension   . Diabetes mellitus   . Chronic back pain   . Kidney stones    History reviewed. No pertinent past surgical history. Family History  Problem Relation Age of Onset  . Hypertension Mother   . Diabetes Mother   . Diabetes Father   . Hypertension Father    Social History   Substance Use Topics  . Smoking status: Former Games developer  . Smokeless tobacco: Never Used  . Alcohol Use: No    Review of Systems  Constitutional: Positive for fever. Negative for diaphoresis.  HENT: Positive for rhinorrhea. Negative for ear pain.   Respiratory: Positive for cough. Negative for shortness of breath.   Cardiovascular: Negative for chest pain.  Musculoskeletal: Positive for myalgias.  Skin: Negative for rash.  All other systems reviewed and are negative.   Allergies  Review of patient's allergies indicates no known allergies.  Home Medications   Prior to Admission medications   Medication Sig Start Date End Date Taking? Authorizing Provider  amLODipine (NORVASC) 10 MG tablet Take 1 tablet (10 mg total) by mouth daily. 12/28/14   Dianne Dun, MD  chlorpheniramine-HYDROcodone (TUSSIONEX PENNKINETIC ER) 10-8 MG/5ML SUER Take 5 mLs by mouth 2 (two) times daily. 08/09/15   Lutricia Feil, PA-C  chlorpheniramine-HYDROcodone (TUSSIONEX PENNKINETIC ER) 10-8 MG/5ML SUER Take 5 mLs by mouth every 12 (twelve) hours as needed for cough. 09/12/15   Hassan Rowan, MD  fexofenadine-pseudoephedrine (ALLEGRA-D ALLERGY & CONGESTION) 180-240 MG 24 hr tablet Take 1 tablet by mouth daily. 09/12/15   Hassan Rowan, MD  fluticasone (FLONASE) 50 MCG/ACT nasal spray Place 2 sprays into both nostrils daily. 08/09/15   Lutricia Feil, PA-C  gemfibrozil (LOPID) 600 MG tablet TAKE 1 TABLET BY MOUTH TWICE DAILY . 12/28/14   Dianne Dun, MD  glyBURIDE (DIABETA) 1.25 MG tablet Take 1.25 mg by mouth daily with breakfast.    Historical  Provider, MD  insulin glargine (LANTUS) 100 UNIT/ML injection Inject 85 Units into the skin at bedtime. 04/22/12   Dianne Dun, MD  losartan-hydrochlorothiazide (HYZAAR) 100-25 MG per tablet Take 1 tablet by mouth daily. 12/28/14   Dianne Dun, MD  meloxicam (MOBIC) 15 MG tablet Take 1 tablet (15 mg total) by mouth daily. 03/12/15   Chinita Pester, FNP  metFORMIN (GLUCOPHAGE)  1000 MG tablet TAKE 1 TABLET BY MOUTH TWICE DAILY 12/28/14   Dianne Dun, MD  metoprolol (LOPRESSOR) 50 MG tablet Take 1 tablet (50 mg total) by mouth 2 (two) times daily. 12/28/14   Dianne Dun, MD  oseltamivir (TAMIFLU) 75 MG capsule Take 1 capsule (75 mg total) by mouth 2 (two) times daily. 09/12/15   Hassan Rowan, MD  penicillin v potassium (VEETID) 500 MG tablet Take 1 tablet (500 mg total) by mouth 2 (two) times daily. 08/11/15   Eustace Moore, MD  tiZANidine (ZANAFLEX) 4 MG tablet Take 1 tablet (4 mg total) by mouth 3 (three) times daily. 03/12/15 03/11/16  Chinita Pester, FNP   Meds Ordered and Administered this Visit  Medications - No data to display  BP 155/90 mmHg  Pulse 73  Temp(Src) 97 F (36.1 C) (Tympanic)  Resp 16  Ht  (1.88 m)  Wt 247 lb (112.038 kg)  BMI 31.70 kg/m2  SpO2 100% No data found.   Physical Exam  Constitutional: He is oriented to person, place, and time. He appears well-developed and well-nourished.  HENT:  Head: Normocephalic and atraumatic.  Right Ear: External ear normal.  Left Ear: External ear normal.  Mouth/Throat: Oropharynx is clear and moist.  Eyes: Conjunctivae are normal. Pupils are equal, round, and reactive to light.  Neck: Normal range of motion. Neck supple. No tracheal deviation present. No thyromegaly present.  Cardiovascular: Normal rate, regular rhythm and normal heart sounds.   Pulmonary/Chest: Effort normal and breath sounds normal. No respiratory distress.  Musculoskeletal: Normal range of motion. He exhibits no tenderness.  Neurological: He is alert and oriented to person, place, and time.  Skin: Skin is warm and dry.  Psychiatric: He has a normal mood and affect.  Vitals reviewed.   ED Course  Procedures (including critical care time)  Labs Review Labs Reviewed  RAPID INFLUENZA A&B ANTIGENS [facility] ONLY)    Imaging Review No results found.   Visual Acuity Review  Right Eye Distance:   Left Eye Distance:    Bilateral Distance:    Right Eye Near:   Left Eye Near:    Bilateral Near:      Results for orders placed or performed during the hospital encounter of 09/12/15  Rapid Influenza A&B Antigens ([facility] only)  Result Value Ref Range   Influenza A ([facility]) NOT DETECTED    Influenza B ([facility]) NOT DETECTED     MDM   1. Flu-like symptoms    Explained to patient even though his flu test was negative still think he has a flu. Placement Tamiflu 75 mg twice a day Tussionex 1 teaspoon twice a day and Allegra-D 1 tablet daily follow-up with PCP in 3-4 days not better and notify if things get worse.   Note: This dictation was prepared with Dragon dictation along with smaller phrase technology. Any transcriptional errors that result from this process are unintentional.,    Hassan Rowan, MD 09/12/15 [phone]

## 2016-01-19 ENCOUNTER — Encounter: Payer: PRIVATE HEALTH INSURANCE | Admitting: Family Medicine

## 2016-02-02 ENCOUNTER — Encounter: Payer: Self-pay | Admitting: Family Medicine

## 2016-02-02 ENCOUNTER — Ambulatory Visit (INDEPENDENT_AMBULATORY_CARE_PROVIDER_SITE_OTHER): Payer: PRIVATE HEALTH INSURANCE | Admitting: Family Medicine

## 2016-02-02 VITALS — BP 142/96 | HR 76 | Temp 98.0°F | Ht 71.0 in | Wt 246.0 lb

## 2016-02-02 DIAGNOSIS — I10 Essential (primary) hypertension: Secondary | ICD-10-CM | POA: Diagnosis not present

## 2016-02-02 DIAGNOSIS — E118 Type 2 diabetes mellitus with unspecified complications: Secondary | ICD-10-CM | POA: Diagnosis not present

## 2016-02-02 DIAGNOSIS — E785 Hyperlipidemia, unspecified: Secondary | ICD-10-CM | POA: Diagnosis not present

## 2016-02-02 DIAGNOSIS — IMO0002 Reserved for concepts with insufficient information to code with codable children: Secondary | ICD-10-CM

## 2016-02-02 DIAGNOSIS — E1165 Type 2 diabetes mellitus with hyperglycemia: Secondary | ICD-10-CM

## 2016-02-02 DIAGNOSIS — Z0000 Encounter for general adult medical examination without abnormal findings: Secondary | ICD-10-CM | POA: Diagnosis not present

## 2016-02-02 LAB — LIPID PANEL
CHOL/HDL RATIO: 4
Cholesterol: 213 mg/dL — ABNORMAL HIGH (ref 0–200)
HDL: 49.1 mg/dL (ref >39.00)
LDL CALC: 139 mg/dL — AB (ref 0–99)
NONHDL: 163.98
TRIGLYCERIDES: 126 mg/dL (ref 0.0–149.0)
VLDL: 25.2 mg/dL (ref 0.0–40.0)

## 2016-02-02 LAB — COMPREHENSIVE METABOLIC PANEL
ALT: 42 U/L (ref 0–53)
AST: 37 U/L (ref 0–37)
Albumin: 4.4 g/dL (ref 3.5–5.2)
Alkaline Phosphatase: 79 U/L (ref 39–117)
BILIRUBIN TOTAL: 0.9 mg/dL (ref 0.2–1.2)
BUN: 14 mg/dL (ref 6–23)
CHLORIDE: 99 meq/L (ref 96–112)
CO2: 28 meq/L (ref 19–32)
Calcium: 9.8 mg/dL (ref 8.4–10.5)
Creatinine, Ser: 0.82 mg/dL (ref 0.40–1.50)
GFR: 135.83 mL/min (ref >60.00)
GLUCOSE: 173 mg/dL — AB (ref 70–99)
Potassium: 3.9 mEq/L (ref 3.5–5.1)
Sodium: 136 mEq/L (ref 135–145)
Total Protein: 7.8 g/dL (ref 6.0–8.3)

## 2016-02-02 LAB — MICROALBUMIN / CREATININE URINE RATIO
CREATININE, U: 144.7 mg/dL
Microalb Creat Ratio: 1.2 mg/g (ref 0.0–30.0)
Microalb, Ur: 1.8 mg/dL (ref 0.0–1.9)

## 2016-02-02 LAB — HEMOGLOBIN A1C: Hgb A1c MFr Bld: 8.2 % — ABNORMAL HIGH (ref 4.6–6.5)

## 2016-02-02 MED ORDER — LOSARTAN POTASSIUM-HCTZ 100-25 MG PO TABS: 1.0000 | ORAL_TABLET | Freq: Every day | ORAL | Status: DC

## 2016-02-02 MED ORDER — GEMFIBROZIL 600 MG PO TABS: ORAL_TABLET | ORAL | Status: DC

## 2016-02-02 MED ORDER — AMLODIPINE BESYLATE 10 MG PO TABS: 10.0000 mg | ORAL_TABLET | Freq: Every day | ORAL | Status: DC

## 2016-02-02 MED ORDER — GLYBURIDE 1.25 MG PO TABS: 1.2500 mg | ORAL_TABLET | Freq: Every day | ORAL | Status: DC

## 2016-02-02 MED ORDER — METOPROLOL TARTRATE 50 MG PO TABS: 50.0000 mg | ORAL_TABLET | Freq: Two times a day (BID) | ORAL | Status: DC

## 2016-02-02 NOTE — Patient Instructions (Signed)
Good to see you. We will call you with your lab results.   

## 2016-02-02 NOTE — Assessment & Plan Note (Signed)
Not quite at goal for a diabetic and has been previously refusing statins. Recheck lipid panel today. He would consider adding one at this point.

## 2016-02-02 NOTE — Assessment & Plan Note (Signed)
Recheck a1c today. Cannot tolerate higher dose metformin. Likely will increase lantus if a1c is elevated today. The patient indicates understanding of these issues and agrees with the plan.

## 2016-02-02 NOTE — Addendum Note (Signed)
Addended by: Baldomero LamyHAVERS, NATASHA C on: 02/02/2016 04:18 PM   Modules accepted: Kipp BroodSmartSet

## 2016-02-02 NOTE — Assessment & Plan Note (Signed)
Reviewed preventive care protocols, scheduled due services, and updated immunizations Discussed nutrition, exercise, diet, and healthy lifestyle.  

## 2016-02-02 NOTE — Assessment & Plan Note (Signed)
Reasonable control on current rx. No changes made today. 

## 2016-02-02 NOTE — Progress Notes (Signed)
Subjective:    Patient ID: Travis Scott, male    DOB: [birth year], 35 y.o.   MRN: [id number]  HPI  35 yo pleasant man here for CPX and follow up of chronic medical conditions.   DM- diagnosed in 2004.  Referred him to endocrinology but he was lost to follow up.  Has not been to them in almost a year. Currently taking Metformin 500 mg twice daily and glyburide 1.25 mg daily, and Lantus 60 units qhs. Was taking Metformin 1000 mg twice daily but couldn't tolerate GI side effects.   Checks his CBGs two times weekly, usually ranging around 100 fasting. Lab Results  Component Value Date   HGBA1C 7.0* 12/28/2014   Has been exercising and going to the gym- weight stable.   Pneumovax given on 12/28/14.  Wt Readings from Last 3 Encounters:  02/02/16 246 lb (111.585 kg)  09/12/15 247 lb (112.038 kg)  08/09/15 245 lb (111.131 kg)    HLD-  Not quite at goal for a diabetic. He is taking lopid but not a statin. Lab Results  Component Value Date   CHOL 180 12/28/2014   HDL 52.40 12/28/2014   LDLCALC 117* 12/28/2014   LDLDIRECT 191.3 04/10/2011   TRIG 53.0 12/28/2014   CHOLHDL 3 12/28/2014   Wt Readings from Last 3 Encounters:  02/02/16 246 lb (111.585 kg)  09/12/15 247 lb (112.038 kg)  08/09/15 245 lb (111.131 kg)    HTN- diagnosed as a teenager. On Metoprolol 50 mg twice daily, losartan/hctz 100/25 mg daily, and amlodipine 10 mg daily.  No CP, SOB or blurred vision. Lab Results  Component Value Date   CREATININE 0.78 12/28/2014  A little elevated today but has been well controlled at home.  Patient Active Problem List   Diagnosis Date Noted  . Visit for well man health check 12/28/2014  . Obesity 04/15/2014  . Hypoglycemia associated with type 2 diabetes mellitus (HCC) 04/15/2014  . Paronychia of third finger of right hand 11/16/2013  . Tobacco abuse 09/07/2013  . Diabetic neuropathy (HCC) 08/14/2012  . Low testosterone 08/14/2012  . Erectile dysfunction 08/14/2012   . Diabetes mellitus out of control (HCC) 12/06/2011  . Fatigue 04/10/2011  . Hyperlipidemia 04/10/2011  . Hypertension    Past Medical History  Diagnosis Date  . Hypertension   . Diabetes mellitus   . Chronic back pain   . Kidney stones    No past surgical history on file. Social History  Substance Use Topics  . Smoking status: Former Games developer  . Smokeless tobacco: Never Used  . Alcohol Use: No   Family History  Problem Relation Age of Onset  . Hypertension Mother   . Diabetes Mother   . Diabetes Father   . Hypertension Father    No Known Allergies   Current outpatient prescriptions:  .  amLODipine (NORVASC) 10 MG tablet, Take 1 tablet (10 mg total) by mouth daily., Disp: 90 tablet, Rfl: 3 .  fluticasone (FLONASE) 50 MCG/ACT nasal spray, Place 2 sprays into both nostrils daily., Disp: 16 g, Rfl: 0 .  gemfibrozil (LOPID) 600 MG tablet, TAKE 1 TABLET BY MOUTH TWICE DAILY ., Disp: 180 tablet, Rfl: 3 .  glyBURIDE (DIABETA) 1.25 MG tablet, Take 1 tablet (1.25 mg total) by mouth daily with breakfast., Disp: 90 tablet, Rfl: 3 .  insulin glargine (LANTUS) 100 UNIT/ML injection, Inject 85 Units into the skin at bedtime., Disp: , Rfl:  .  losartan-hydrochlorothiazide (HYZAAR) 100-25 MG tablet, Take 1 tablet by  mouth daily., Disp: 90 tablet, Rfl: 3 .  meloxicam (MOBIC) 15 MG tablet, Take 1 tablet (15 mg total) by mouth daily., Disp: 30 tablet, Rfl: 0 .  metFORMIN (GLUCOPHAGE) 1000 MG tablet, TAKE 1 TABLET BY MOUTH TWICE DAILY, Disp: 180 tablet, Rfl: 1 .  metoprolol (LOPRESSOR) 50 MG tablet, Take 1 tablet (50 mg total) by mouth 2 (two) times daily., Disp: 180 tablet, Rfl: 3 .  penicillin v potassium (VEETID) 500 MG tablet, Take 1 tablet (500 mg total) by mouth 2 (two) times daily., Disp: 20 tablet, Rfl: 0 .  tiZANidine (ZANAFLEX) 4 MG tablet, Take 1 tablet (4 mg total) by mouth 3 (three) times daily., Disp: 90 tablet, Rfl: 0  The PMH, PSH, Social History, Family History, Medications,  and allergies [facility], and have been updated if relevant.   Review of Systems Review of Systems  Constitutional: Negative.   HENT: Negative.   Eyes: Negative.   Respiratory: Negative.   Cardiovascular: Negative.   Gastrointestinal: Negative.   Endocrine: Negative.   Musculoskeletal: Negative.   Skin: Negative.   Allergic/Immunologic: Negative.   Neurological: Negative.   Hematological: Negative.   Psychiatric/Behavioral: Negative.   All other systems reviewed and are negative.      Objective:   Physical Exam BP 142/96 mmHg  Pulse 76  Temp(Src) 98 F (36.7 C) (Oral)  Ht 5\' 11"  (1.803 m)  Wt 246 lb (111.585 kg)  BMI 34.33 kg/m2  SpO2 97% General:  overweght male in NAD Eyes:  PERRL Ears:  External ear exam shows no significant lesions or deformities.  Otoscopic examination reveals clear canals, tympanic membranes are intact bilaterally without bulging, retraction, inflammation or discharge. Hearing is grossly normal bilaterally. Nose:  External nasal examination shows no deformity or inflammation. Nasal mucosa are pink and moist without lesions or exudates. Neck:  no carotid bruit or thyromegaly no cervical or supraclavicular lymphadenopathy  Lungs:  Normal respiratory effort, chest expands symmetrically. Lungs are clear to auscultation, no crackles or wheezes. Heart:  Normal rate and regular rhythm. S1 and S2 normal without gallop, murmur, click, rub or other extra sounds. Abdomen:  Bowel sounds positive,abdomen soft and non-tender without masses, organomegaly or hernias noted. Pulses:  R and L posterior tibial pulses are full and equal bilaterally  Extremities:  no edema         Assessment & Plan:

## 2016-02-06 ENCOUNTER — Other Ambulatory Visit: Payer: Self-pay | Admitting: *Deleted

## 2016-02-06 MED ORDER — INSULIN GLARGINE 100 UNIT/ML ~~LOC~~ SOLN: 85.0000 [IU] | Freq: Every day | SUBCUTANEOUS | 5 refills | Status: DC

## 2016-02-07 MED ORDER — METFORMIN HCL 500 MG PO TABS: ORAL_TABLET | ORAL | 1 refills | Status: DC

## 2016-02-07 NOTE — Addendum Note (Signed)
Addended by: Desmond Dike on: 02/07/2016 11:16 AM   Modules accepted: Orders

## 2016-02-22 ENCOUNTER — Ambulatory Visit (INDEPENDENT_AMBULATORY_CARE_PROVIDER_SITE_OTHER): Payer: PRIVATE HEALTH INSURANCE | Admitting: Family Medicine

## 2016-02-22 ENCOUNTER — Encounter: Payer: Self-pay | Admitting: Family Medicine

## 2016-02-22 VITALS — BP 128/76 | HR 63 | Temp 98.1°F | Wt 253.5 lb

## 2016-02-22 DIAGNOSIS — I10 Essential (primary) hypertension: Secondary | ICD-10-CM

## 2016-02-22 NOTE — Progress Notes (Signed)
Pre visit review using our clinic review tool, if applicable. No additional management support is needed unless otherwise documented below in the visit note. 

## 2016-02-22 NOTE — Progress Notes (Signed)
Subjective:    Patient ID: Travis Scott, male    DOB: Jun 10, 1981, 35 y.o.   MRN: [id number]  HPI  35 yo pleasant man here for blood pressure issues.   HTN- diagnosed as a teenager. On Metoprolol 50 mg twice daily, losartan/hctz 100/25 mg daily, and amlodipine 10 mg daily.  No CP, SOB or blurred vision.  BP was a little high at last OV.  He has been feeling fine, just wanted to make sure it was ok.  Patient Active Problem List  Diagnosis  . Hypertension  . Fatigue  . Hyperlipidemia  . Diabetes mellitus out of control (HCC)  . Diabetic neuropathy (HCC)  . Low testosterone  . Erectile dysfunction  . Tobacco abuse  . Paronychia of third finger of right hand  . Obesity  . Hypoglycemia associated with type 2 diabetes mellitus (HCC)  . Visit for well man health check   Past Medical History:  Diagnosis Date  . Chronic back pain   . Diabetes mellitus   . Hypertension   . Kidney stones    No past surgical history on file. Social History  Substance Use Topics  . Smoking status: Former Games developermoker  . Smokeless tobacco: Never Used  . Alcohol use No   Family History  Problem Relation Age of Onset  . Hypertension Mother   . Diabetes Mother   . Diabetes Father   . Hypertension Father    No Known Allergies   Current Outpatient Prescriptions:  .  amLODipine (NORVASC) 10 MG tablet, Take 1 tablet (10 mg total) by mouth daily., Disp: 90 tablet, Rfl: 3 .  fluticasone (FLONASE) 50 MCG/ACT nasal spray, Place 2 sprays into both nostrils daily., Disp: 16 g, Rfl: 0 .  gemfibrozil (LOPID) 600 MG tablet, TAKE 1 TABLET BY MOUTH TWICE DAILY ., Disp: 180 tablet, Rfl: 3 .  glyBURIDE (DIABETA) 1.25 MG tablet, Take 1 tablet (1.25 mg total) by mouth daily with breakfast., Disp: 90 tablet, Rfl: 3 .  insulin glargine (LANTUS) 100 UNIT/ML injection, Inject 0.85 mLs (85 Units total) into the skin at bedtime., Disp: 10 mL, Rfl: 5 .  losartan-hydrochlorothiazide (HYZAAR) 100-25 MG tablet, Take 1  tablet by mouth daily., Disp: 90 tablet, Rfl: 3 .  meloxicam (MOBIC) 15 MG tablet, Take 1 tablet (15 mg total) by mouth daily., Disp: 30 tablet, Rfl: 0 .  metFORMIN (GLUCOPHAGE) 500 MG tablet, TAKE 1 TABLET BY MOUTH TWICE DAILY, Disp: 180 tablet, Rfl: 1 .  metoprolol (LOPRESSOR) 50 MG tablet, Take 1 tablet (50 mg total) by mouth 2 (two) times daily., Disp: 180 tablet, Rfl: 3 .  tiZANidine (ZANAFLEX) 4 MG tablet, Take 1 tablet (4 mg total) by mouth 3 (three) times daily., Disp: 90 tablet, Rfl: 0  The PMH, PSH, Social History, Family History, Medications, and allergies [facility], and have been updated if relevant.   Review of Systems Review of Systems  Constitutional: Negative.   HENT: Negative.   Eyes: Negative.   Respiratory: Negative.   Cardiovascular: Negative.   Gastrointestinal: Negative.   Endocrine: Negative.   Musculoskeletal: Negative.   Skin: Negative.   Allergic/Immunologic: Negative.   Neurological: Negative.   Hematological: Negative.   Psychiatric/Behavioral: Negative.   All other systems reviewed and are negative.      Objective:   Physical Exam BP 128/76   Pulse 63   Temp 98.1 F (36.7 C) (Oral)   Wt 253 lb 8 oz (115 kg)   SpO2 98%  BMI 35.36 kg/m  General:  overweght male in NAD Neck:  no carotid bruit or thyromegaly no cervical or supraclavicular lymphadenopathy  Lungs:  Normal respiratory effort, chest expands symmetrically. Lungs are clear to auscultation, no crackles or wheezes. Heart:  Normal rate and regular rhythm. S1 and S2 normal without gallop, murmur, click, rub or other extra sounds. Abdomen:  Bowel sounds positive,abdomen soft and non-tender without masses, organomegaly or hernias noted. Pulses:  R and L posterior tibial pulses are full and equal bilaterally  Extremities:  no edema  Psych:  Good eye contact, not anxious or depressed appearing     Assessment & Plan:

## 2016-02-22 NOTE — Assessment & Plan Note (Signed)
Well controlled. No changes made. Reassurance provided.

## 2016-04-30 ENCOUNTER — Ambulatory Visit (INDEPENDENT_AMBULATORY_CARE_PROVIDER_SITE_OTHER): Payer: PRIVATE HEALTH INSURANCE | Admitting: Family Medicine

## 2016-04-30 ENCOUNTER — Encounter: Payer: Self-pay | Admitting: Family Medicine

## 2016-04-30 VITALS — BP 142/92 | HR 77 | Temp 98.2°F | Wt 258.2 lb

## 2016-04-30 DIAGNOSIS — K529 Noninfective gastroenteritis and colitis, unspecified: Secondary | ICD-10-CM

## 2016-04-30 DIAGNOSIS — Z23 Encounter for immunization: Secondary | ICD-10-CM | POA: Diagnosis not present

## 2016-04-30 NOTE — Progress Notes (Signed)
Pre visit review using our clinic review tool, if applicable. No additional management support is needed unless otherwise documented below in the visit note. 

## 2016-04-30 NOTE — Patient Instructions (Signed)
Food Poisoning °Food poisoning is an illness caused by something you ate or drank. There are over 250 known causes of food poisoning. However, many other causes are unknown. You can be treated even if the exact cause of your food poisoning is not known. In most cases, food poisoning is mild and lasts 1 to 2 days. However, some cases can be serious, especially for people with low immune systems, the elderly, children and infants, and pregnant women. °CAUSES  °Poor personal hygiene, improper cleaning of storage and preparation areas, and unclean utensils can cause infection or tainting (contamination) of foods. The causes of food poisoning are numerous. Infectious agents, such as viruses, bacteria, or parasites, can cause harm by infecting the intestine and disrupting the absorption of nutrients and water. This can cause diarrhea and lead to dehydration. Viruses are responsible for most of the food poisonings in which an agent is found. Parasites are less likely to cause food poisoning. Toxic agents, such as poisonous mushrooms, marine algae, and pesticides can also cause food poisoning. °· Viral causes of food poisoning include: °¨ Norovirus. °¨ Rotavirus. °¨ Hepatitis A. °· Bacterial causes of food poisoning include: °¨ Salmonellae. °¨ Campylobacter. °¨ Bacillus cereus. °¨ Escherichia coli (E. coli). °¨ Shigella. °¨ Listeria monocytogenes. °¨ Clostridium botulinum (botulism). °¨ Vibrio cholerae. °· Parasites that can cause food poisoning include: °¨ Giardia. °¨ Cryptosporidium. °¨ Toxoplasma. °SYMPTOMS °Symptoms may appear several hours or longer after consuming the contaminated food or drink. Symptoms may include: °· Nausea. °· Vomiting. °· Cramping. °· Diarrhea. °· Fever and chills. °· Muscle aches. °DIAGNOSIS °Your health care provider may be able to diagnose food poisoning from a list of what you have recently eaten and results from lab tests. Diagnostic tests may include an exam of the feces. °TREATMENT °In  most cases, treatment focuses on helping to relieve your symptoms and staying well hydrated. Antibiotic medicines are rarely needed. In severe cases, hospitalization may be required. °HOME CARE INSTRUCTIONS  °· Drink enough water and fluids to keep your urine clear or pale yellow. Drink small amounts of fluids frequently and increase as tolerated. °· Ask your health care provider for specific rehydration instructions. °· Avoid: °¨ Foods high in sugar. °¨ Alcohol. °¨ Carbonated drinks. °¨ Tobacco. °¨ Juice. °¨ Caffeine drinks. °¨ Extremely hot or cold fluids. °¨ Fatty, greasy foods. °¨ Too much intake of anything at one time. °¨ Dairy products until 24 to 48 hours after diarrhea stops. °· You may consume probiotics. Probiotics are active cultures of beneficial bacteria. They may lessen the amount and number of diarrheal stools in adults. Probiotics can be found in yogurt with active cultures and in supplements. °· Wash your hands well to avoid spreading the bacteria. °· Take medicines only as directed by your health care provider. Do not give your child aspirin because of the association with Reye's syndrome. °· Ask your health care provider if you should continue to take your regular prescribed and over-the-counter medicines. °PREVENTION  °· Wash your hands, food preparation surfaces, and utensils thoroughly before and after handling raw foods. °· Keep refrigerated foods below 40°F (5°C). °· Serve hot foods immediately or keep them heated above 140°F (60°C). °· Divide large volumes of food into small portions for rapid cooling in the refrigerator. Hot, bulky foods in the refrigerator can raise the temperature of other foods that have already cooled. °· Follow approved canning procedures. °· Heat canned foods thoroughly before tasting. °· When in doubt, throw it out. °· Infants, the elderly, women   who are pregnant, and people with compromised immune systems are especially susceptible to food poisoning. These people  should never consume unpasteurized cheese, unpasteurized cider, raw fish, raw seafood, or raw meat-type products. °SEEK IMMEDIATE MEDICAL CARE IF:  °· You have difficulty breathing, swallowing, talking, or moving. °· You develop blurred vision. °· You are unable to keep fluids down. °· You faint or nearly faint. °· Your eyes turn yellow. °· Vomiting or diarrhea develops or becomes persistent. °· Abdominal pain develops, increases, or localizes in one small area. °· You have a fever. °· The diarrhea becomes excessive or contains blood or mucus. °· You develop excessive weakness, dizziness, or extreme thirst. °· You have no urine for 8 hours. °MAKE SURE YOU:  °· Understand these instructions. °· Will watch your condition. °· Will get help right away if you are not doing well or get worse. °  °This information is not intended to replace advice given to you by your health care provider. Make sure you discuss any questions you have with your health care provider. °  °Document Released: 03/30/2004 Document Revised: 07/23/2014 Document Reviewed: 01/03/2015 °[employer]vier Interactive Patient Education ©2016 [employer] ° °

## 2016-04-30 NOTE — Progress Notes (Deleted)
   Subjective:   Patient ID: Tamera Puntatrick L Jacome, male    DOB: 1980/12/23, 35 y.o.   MRN: [id number]  Tamera Puntatrick L Mayer is a pleasant 35 y.o. year old male who presents to clinic today with Abdominal Pain  on 04/30/2016  HPI:    Current Outpatient Prescriptions on File Prior to Visit  Medication Sig Dispense Refill  . amLODipine (NORVASC) 10 MG tablet Take 1 tablet (10 mg total) by mouth daily. 90 tablet 3  . fluticasone (FLONASE) 50 MCG/ACT nasal spray Place 2 sprays into both nostrils daily. 16 g 0  . gemfibrozil (LOPID) 600 MG tablet TAKE 1 TABLET BY MOUTH TWICE DAILY . 180 tablet 3  . glyBURIDE (DIABETA) 1.25 MG tablet Take 1 tablet (1.25 mg total) by mouth daily with breakfast. 90 tablet 3  . insulin glargine (LANTUS) 100 UNIT/ML injection Inject 0.85 mLs (85 Units total) into the skin at bedtime. 10 mL 5  . losartan-hydrochlorothiazide (HYZAAR) 100-25 MG tablet Take 1 tablet by mouth daily. 90 tablet 3  . meloxicam (MOBIC) 15 MG tablet Take 1 tablet (15 mg total) by mouth daily. 30 tablet 0  . metFORMIN (GLUCOPHAGE) 500 MG tablet TAKE 1 TABLET BY MOUTH TWICE DAILY 180 tablet 1  . metoprolol (LOPRESSOR) 50 MG tablet Take 1 tablet (50 mg total) by mouth 2 (two) times daily. 180 tablet 3   No current facility-administered medications on file prior to visit.     No Known Allergies  Past Medical History:  Diagnosis Date  . Chronic back pain   . Diabetes mellitus   . Hypertension   . Kidney stones     No past surgical history on file.  Family History  Problem Relation Age of Onset  . Hypertension Mother   . Diabetes Mother   . Diabetes Father   . Hypertension Father     Social History   Social History  . Marital status: Married    Spouse name: N/A  . Number of children: 2  . Years of education: N/A   Occupational History  .  [employer]   Social History Main Topics  . Smoking status: Former Games developermoker  . Smokeless tobacco: Never Used  . Alcohol use No  . Drug use:  No  . Sexual activity: Yes   Other Topics Concern  . Not on file   Social History Narrative  . No narrative on file   The PMH, PSH, Social History, Family History, Medications, and allergies [facility], and have been updated if relevant.   Review of Systems     Objective:    There were no vitals taken for this visit.   Physical Exam        Assessment & Plan:   No diagnosis found. No Follow-up on file.

## 2016-04-30 NOTE — Progress Notes (Signed)
(  S) Travis Scott is a 35 y.o. male with complaint of gastrointestinal symptoms of watery diarrhea, abdominal pain, nausea, vomiting for 2 days. No blood in stool.  Started shortly after eating at a mall.  Nausea and vomiting have resolved.  Diarrhea is improving.  Current Outpatient Prescriptions on File Prior to Visit  Medication Sig Dispense Refill  . amLODipine (NORVASC) 10 MG tablet Take 1 tablet (10 mg total) by mouth daily. 90 tablet 3  . fluticasone (FLONASE) 50 MCG/ACT nasal spray Place 2 sprays into both nostrils daily. 16 g 0  . gemfibrozil (LOPID) 600 MG tablet TAKE 1 TABLET BY MOUTH TWICE DAILY . 180 tablet 3  . glyBURIDE (DIABETA) 1.25 MG tablet Take 1 tablet (1.25 mg total) by mouth daily with breakfast. 90 tablet 3  . insulin glargine (LANTUS) 100 UNIT/ML injection Inject 0.85 mLs (85 Units total) into the skin at bedtime. 10 mL 5  . losartan-hydrochlorothiazide (HYZAAR) 100-25 MG tablet Take 1 tablet by mouth daily. 90 tablet 3  . meloxicam (MOBIC) 15 MG tablet Take 1 tablet (15 mg total) by mouth daily. 30 tablet 0  . metFORMIN (GLUCOPHAGE) 500 MG tablet TAKE 1 TABLET BY MOUTH TWICE DAILY 180 tablet 1  . metoprolol (LOPRESSOR) 50 MG tablet Take 1 tablet (50 mg total) by mouth 2 (two) times daily. 180 tablet 3   No current facility-administered medications on file prior to visit.     No Known Allergies  Past Medical History:  Diagnosis Date  . Chronic back pain   . Diabetes mellitus   . Hypertension   . Kidney stones     No past surgical history on file.  Family History  Problem Relation Age of Onset  . Hypertension Mother   . Diabetes Mother   . Diabetes Father   . Hypertension Father     Social History   Social History  . Marital status: Married    Spouse name: N/A  . Number of children: 2  . Years of education: N/A   Occupational History  .  [employer]   Social History Main Topics  . Smoking status: Former Games developermoker  . Smokeless tobacco: Never  Used  . Alcohol use No  . Drug use: No  . Sexual activity: Yes   Other Topics Concern  . Not on file   Social History Narrative  . No narrative on file   The PMH, PSH, Social History, Family History, Medications, and allergies have been [facility], and have been updated if relevant.  (O)  BP (!) 142/92   Pulse 77   Temp 98.2 F (36.8 C) (Oral)   Wt 258 lb 4 oz (117.1 kg)   SpO2 98%   BMI 36.02 kg/m   Physical exam reveals the patient appears well. Hydration status: well hydrated. Abdomen: abdomen is soft without significant tenderness, masses, organomegaly or guarding..  (A) Viral Gastroenteritis  (P) I have recommended small amounts clear fluids frequently, soups, juices, water and advance diet as tolerated. Return office visit if symptoms persist or worsen; I have alerted the patient to call if high fever, dehydration, marked weakness, fainting, increased abdominal pain, blood in stool or vomit.

## 2016-04-30 NOTE — Addendum Note (Signed)
Addended by: Desmond DikeKNIGHT, Ashla Murph H on: 04/30/2016 12:08 PM   Modules accepted: Orders

## 2016-05-04 ENCOUNTER — Other Ambulatory Visit: Payer: PRIVATE HEALTH INSURANCE

## 2016-06-19 ENCOUNTER — Ambulatory Visit (INDEPENDENT_AMBULATORY_CARE_PROVIDER_SITE_OTHER): Payer: PRIVATE HEALTH INSURANCE

## 2016-06-19 ENCOUNTER — Ambulatory Visit
Admission: EM | Admit: 2016-06-19 | Discharge: 2016-06-19 | Disposition: A | Discharge status: Home / Self Care | Payer: PRIVATE HEALTH INSURANCE | Attending: Emergency Medicine | Admitting: Emergency Medicine

## 2016-06-19 DIAGNOSIS — M79671 Pain in right foot: Secondary | ICD-10-CM

## 2016-06-19 MED ORDER — IBUPROFEN 800 MG PO TABS: 800.0000 mg | ORAL_TABLET | Freq: Three times a day (TID) | ORAL | 0 refills | Status: DC | PRN

## 2016-06-19 MED ORDER — HYDROCODONE-ACETAMINOPHEN 5-325 MG PO TABS: 1.0000 | ORAL_TABLET | ORAL | 0 refills | Status: DC | PRN

## 2016-06-19 NOTE — Discharge Instructions (Signed)
Follow-up with Dr. Ether GriffinsFowler, podiatrist in a week if no better with ice, elevation, ibuprofen with 1 g of Tylenol up to 3 times a day and Norco for severe pain only. Do not take the Norco and the Tylenol as they both have Tylenol in them and too much can hurt your liver. Each pill with Norco has 325 mg of Tylenol in it. Do not take more than 4 g of Tylenol from all sources in one day.

## 2016-06-19 NOTE — ED Triage Notes (Signed)
Patient complains of right foot pain. Patient states that he woke up with this yesterday morning. Patient states that the pain is worse over the top of his foot. Patient states that he has had no known injury to his foot.

## 2016-06-19 NOTE — ED Provider Notes (Signed)
HPI  SUBJECTIVE:  Travis Scott is a 35 y.o. male who presents with right intermittent, sharp, foot pain along the dorsum of his foot. Patient states he woke up with his yesterday, states that he can't walk. Symptoms are better with plantar flexion on the floor, worse standing and walking. He tried an ankle brace, elevation without much improvement. He denies ankle pain, rash, erythema, swelling, fevers, trauma to the foot, proceeding back or leg pain. No plantar pain. No antipyretic in the past 6-8 hours. No new shoes. He states that he is not working more, no change in his physical activity, recent running. Wears heavy boots for work. He had similar symptoms before, was sent home on  crutches and and a boot and it got better. He has past medical history of hypertension, diabetes, degenerative disc disease in his back. No history of shingles, chickenpox, osteoporosis or gout. PMD. Ruthe Mannanalia Aron, MD     Past Medical History:  Diagnosis Date  . Chronic back pain   . Diabetes mellitus   . Hypertension   . Kidney stones     Past Surgical History:  Procedure Laterality Date  . NO PAST SURGERIES      Family History  Problem Relation Age of Onset  . Hypertension Mother   . Diabetes Mother   . Diabetes Father   . Hypertension Father     Social History  Substance Use Topics  . Smoking status: Former Games developermoker  . Smokeless tobacco: Never Used  . Alcohol use No    No current facility-administered medications for this encounter.   Current Outpatient Prescriptions:  .  amLODipine (NORVASC) 10 MG tablet, Take 1 tablet (10 mg total) by mouth daily., Disp: 90 tablet, Rfl: 3 .  gemfibrozil (LOPID) 600 MG tablet, TAKE 1 TABLET BY MOUTH TWICE DAILY ., Disp: 180 tablet, Rfl: 3 .  glyBURIDE (DIABETA) 1.25 MG tablet, Take 1 tablet (1.25 mg total) by mouth daily with breakfast., Disp: 90 tablet, Rfl: 3 .  insulin glargine (LANTUS) 100 UNIT/ML injection, Inject 0.85 mLs (85 Units total) into the  skin at bedtime., Disp: 10 mL, Rfl: 5 .  losartan-hydrochlorothiazide (HYZAAR) 100-25 MG tablet, Take 1 tablet by mouth daily., Disp: 90 tablet, Rfl: 3 .  metFORMIN (GLUCOPHAGE) 500 MG tablet, TAKE 1 TABLET BY MOUTH TWICE DAILY, Disp: 180 tablet, Rfl: 1 .  metoprolol (LOPRESSOR) 50 MG tablet, Take 1 tablet (50 mg total) by mouth 2 (two) times daily., Disp: 180 tablet, Rfl: 3 .  HYDROcodone-acetaminophen (NORCO/VICODIN) 5-325 MG tablet, Take 1-2 tablets by mouth every 4 (four) hours as needed for moderate pain., Disp: 20 tablet, Rfl: 0 .  ibuprofen (ADVIL,MOTRIN) 800 MG tablet, Take 1 tablet (800 mg total) by mouth every 8 (eight) hours as needed., Disp: 30 tablet, Rfl: 0  No Known Allergies   ROS  As noted in HPI.   Physical Exam  BP (!) 156/93 (BP Location: Left Arm)   Pulse 77   Temp 98.3 F (36.8 C) (Oral)   Resp 17   Ht 6' (1.829 m)   Wt 250 lb (113.4 kg)   SpO2 97%   BMI 33.91 kg/m   Constitutional: Well developed, well nourished, no acute distress Eyes:  EOMI, conjunctiva normal bilaterally HENT: Normocephalic, atraumatic,mucus membranes moist Respiratory: Normal inspiratory effort Cardiovascular: Normal rate GI: nondistended skin: No rash, skin intact Musculoskeletal: No tenderness over the entire ankle, medial, lateral ankle ligaments, ATFL. Tenderness over the right lateral mid foot. No erythema, edema, no tenderness  over the fifth metatarsal. No rash. No bony tenderness along the rest of the foot plantar surface nontender, calcaneus nontender. Pt able to move toes up and down. DP 2+. No pain with plantar flexion/dorsiflexion, pain with inversion of the foot.  Neurologic: Alert & oriented x 3, no focal neuro deficits Psychiatric: Speech and behavior appropriate   ED Course   Medications - No data to display  Orders Placed This Encounter  Procedures  . DG Foot Complete Right    Standing Status:   Standing    Number of Occurrences:   1    Order Specific  Question:   Reason for Exam (SYMPTOM  OR DIAGNOSIS REQUIRED)    Answer:   pain dorsum midfoot. r/o fx dislocation  . Apply cam walker    Standing Status:   Standing    Number of Occurrences:   1    Order Specific Question:   Laterality    Answer:   Right  . Maintain cam walker    Standing Status:   Standing    Number of Occurrences:   1    Order Specific Question:   Laterality    Answer:   Right  . Crutches    Standing Status:   Standing    Number of Occurrences:   1    No results found for this or any previous visit (from the past 24 hour(s)). Dg Foot Complete Right  Result Date: 06/19/2016 CLINICAL DATA:  Pain about the dorsum of the right midfoot for 1 day. No known injury. EXAM: RIGHT FOOT COMPLETE - 3+ VIEW COMPARISON:  None. FINDINGS: There is no evidence of fracture or dislocation. There is no evidence of arthropathy or other focal bone abnormality. Soft tissues are unremarkable. IMPRESSION: Negative exam. Electronically Signed   By: Drusilla Kannerhomas  Dalessio M.D.   On: 06/19/2016 13:57    ED Clinical Impression  Foot pain, right   ED Assessment/Plan  [facility] [place] [place] [facility]. No opiate prescriptions in the past 6 months.  Presentation most consistent with a ligamentous injury versus soreness due to repeated trauma in this area from his shoes. No evidence of cellulitis, gout, shingles.  We'll check x-ray just to make sure that there is nothing fractured given that he does have bony tenderness. Plan to send home with walking boot and cane if this is negative. Home with ibuprofen 800 mg 1 g of Tylenol 3 times a day, Norco for severe pain, podiatry referral to Dr. Ether GriffinsFowler if no better in a week.  Reviewed imaging independently. No fracture, dislocation. See radiology report for details.  Plan as above  Discussed imaging, MDM, plan and followup with patient . Discussed sn/sx that should prompt return to the ED. Patient  agrees with plan.   Meds ordered this  encounter  Medications  . ibuprofen (ADVIL,MOTRIN) 800 MG tablet    Sig: Take 1 tablet (800 mg total) by mouth every 8 (eight) hours as needed.    Dispense:  30 tablet    Refill:  0  . HYDROcodone-acetaminophen (NORCO/VICODIN) 5-325 MG tablet    Sig: Take 1-2 tablets by mouth every 4 (four) hours as needed for moderate pain.    Dispense:  20 tablet    Refill:  0    *This clinic note was created using Scientist, clinical (histocompatibility and immunogenetics)Dragon dictation software. Therefore, there may be occasional mistakes despite careful proofreading.  ?   Domenick GongAshley Infiniti Hoefling, MD 06/19/16 [phone]

## 2016-06-22 ENCOUNTER — Telehealth: Payer: Self-pay

## 2016-06-22 NOTE — Telephone Encounter (Signed)
Courtesy call back completed today after patient's visit at [facility]. Patient improved and will call back with any questions or concerns.  

## 2016-07-20 ENCOUNTER — Ambulatory Visit
Admission: EM | Admit: 2016-07-20 | Discharge: 2016-07-20 | Disposition: A | Discharge status: Home / Self Care | Payer: BLUE CROSS/BLUE SHIELD | Attending: Family Medicine | Admitting: Family Medicine

## 2016-07-20 ENCOUNTER — Encounter: Payer: Self-pay | Admitting: Emergency Medicine

## 2016-07-20 DIAGNOSIS — R059 Cough, unspecified: Secondary | ICD-10-CM

## 2016-07-20 DIAGNOSIS — J40 Bronchitis, not specified as acute or chronic: Secondary | ICD-10-CM

## 2016-07-20 DIAGNOSIS — R05 Cough: Secondary | ICD-10-CM | POA: Diagnosis not present

## 2016-07-20 MED ORDER — SULFAMETHOXAZOLE-TRIMETHOPRIM 800-160 MG PO TABS: 1.0000 | ORAL_TABLET | Freq: Two times a day (BID) | ORAL | 0 refills | Status: DC

## 2016-07-20 MED ORDER — HYDROCOD POLST-CPM POLST ER 10-8 MG/5ML PO SUER: 5.0000 mL | Freq: Two times a day (BID) | ORAL | 0 refills | Status: DC | PRN

## 2016-07-20 NOTE — ED Provider Notes (Signed)
[facility]    CSN: [id number] Arrival date & time: 07/20/16  1551     History   Chief Complaint Chief Complaint  Patient presents with  . Cough    HPI Travis Scott is a 36 y.o. male.   The history is provided by the patient.  Cough  Associated symptoms: wheezing   Associated symptoms: no fever   URI  Presenting symptoms: congestion, cough and fatigue   Presenting symptoms: no fever   Severity:  Moderate Onset quality:  Sudden Duration:  1 week Timing:  Constant Progression:  Worsening Chronicity:  New Relieved by:  Nothing Ineffective treatments:  OTC medications Associated symptoms: wheezing   Risk factors: diabetes mellitus and sick contacts   Risk factors: not elderly, no chronic cardiac disease, no chronic kidney disease, no chronic respiratory disease, no immunosuppression, no recent illness and no recent travel     Past Medical History:  Diagnosis Date  . Chronic back pain   . Diabetes mellitus   . Hypertension   . Kidney stones     Patient Active Problem List   Diagnosis Date Noted  . Obesity 04/15/2014  . Hypoglycemia associated with type 2 diabetes mellitus (HCC) 04/15/2014  . Paronychia of third finger of right hand 11/16/2013  . Tobacco abuse 09/07/2013  . Diabetic neuropathy (HCC) 08/14/2012  . Low testosterone 08/14/2012  . Erectile dysfunction 08/14/2012  . Diabetes mellitus out of control (HCC) 12/06/2011  . Fatigue 04/10/2011  . Hyperlipidemia 04/10/2011  . Hypertension     Past Surgical History:  Procedure Laterality Date  . NO PAST SURGERIES         Home Medications    Prior to Admission medications   Medication Sig Start Date End Date Taking? Authorizing Provider  amLODipine (NORVASC) 10 MG tablet Take 1 tablet (10 mg total) by mouth daily. 02/02/16   Dianne Dun, MD  chlorpheniramine-HYDROcodone (TUSSIONEX PENNKINETIC ER) 10-8 MG/5ML SUER Take 5 mLs by mouth every 12 (twelve) hours as needed. 07/20/16    Payton Mccallum, MD  gemfibrozil (LOPID) 600 MG tablet TAKE 1 TABLET BY MOUTH TWICE DAILY . 02/02/16   Dianne Dun, MD  glyBURIDE (DIABETA) 1.25 MG tablet Take 1 tablet (1.25 mg total) by mouth daily with breakfast. 02/02/16   Dianne Dun, MD  ibuprofen (ADVIL,MOTRIN) 800 MG tablet Take 1 tablet (800 mg total) by mouth every 8 (eight) hours as needed. 06/19/16   Domenick Gong, MD  insulin glargine (LANTUS) 100 UNIT/ML injection Inject 0.85 mLs (85 Units total) into the skin at bedtime. 02/06/16   Dianne Dun, MD  losartan-hydrochlorothiazide (HYZAAR) 100-25 MG tablet Take 1 tablet by mouth daily. 02/02/16   Dianne Dun, MD  metFORMIN (GLUCOPHAGE) 500 MG tablet TAKE 1 TABLET BY MOUTH TWICE DAILY 02/07/16   Dianne Dun, MD  metoprolol (LOPRESSOR) 50 MG tablet Take 1 tablet (50 mg total) by mouth 2 (two) times daily. 02/02/16   Dianne Dun, MD  sulfamethoxazole-trimethoprim (BACTRIM DS,SEPTRA DS) 800-160 MG tablet Take 1 tablet by mouth 2 (two) times daily. 07/20/16   Payton Mccallum, MD    Family History Family History  Problem Relation Age of Onset  . Hypertension Mother   . Diabetes Mother   . Diabetes Father   . Hypertension Father     Social History Social History  Substance Use Topics  . Smoking status: Former Games developer  . Smokeless tobacco: Never Used  . Alcohol use No  Allergies   Patient has no known allergies.   Review of Systems Review of Systems  Constitutional: Positive for fatigue. Negative for fever.  HENT: Positive for congestion.   Respiratory: Positive for cough and wheezing.      Physical Exam Triage Vital Signs ED Triage Vitals  Enc Vitals Group     BP 07/20/16 1614 (!) 145/95     Pulse Rate 07/20/16 1614 68     Resp 07/20/16 1614 16     Temp 07/20/16 1614 98.5 F (36.9 C)     Temp Source 07/20/16 1614 Oral     SpO2 07/20/16 1614 100 %     Weight 07/20/16 1613 250 lb (113.4 kg)     Height 07/20/16 1613 6\' 1"  (1.854 m)     Head Circumference --       Peak Flow --      Pain Score 07/20/16 1614 0     Pain Loc --      Pain Edu? --      Excl. in GC? --    No data found.   Updated Vital Signs BP (!) 145/95 (BP Location: Right Arm)   Pulse 68   Temp 98.5 F (36.9 C) (Oral)   Resp 16   Ht 6\' 1"  (1.854 m)   Wt 250 lb (113.4 kg)   SpO2 100%   BMI 32.98 kg/m   Visual Acuity Right Eye Distance:   Left Eye Distance:   Bilateral Distance:    Right Eye Near:   Left Eye Near:    Bilateral Near:     Physical Exam  Constitutional: He appears well-developed and well-nourished. No distress.  HENT:  Head: Normocephalic and atraumatic.  Right Ear: Tympanic membrane, external ear and ear canal normal.  Left Ear: Tympanic membrane, external ear and ear canal normal.  Nose: Nose normal.  Mouth/Throat: Uvula is midline, oropharynx is clear and moist and mucous membranes are normal. No oropharyngeal exudate or tonsillar abscesses.  Eyes: Conjunctivae and EOM are normal. Pupils are equal, round, and reactive to light. Right eye exhibits no discharge. Left eye exhibits no discharge. No scleral icterus.  Neck: Normal range of motion. Neck supple. No tracheal deviation present. No thyromegaly present.  Cardiovascular: Normal rate, regular rhythm and normal heart sounds.   Pulmonary/Chest: Effort normal. No stridor. No respiratory distress. He has no wheezes. He has no rales. He exhibits no tenderness.  Diffuse rhonchi  Lymphadenopathy:    He has no cervical adenopathy.  Neurological: He is alert.  Skin: Skin is warm and dry. No rash noted. He is not diaphoretic.  Nursing note and vitals reviewed.    UC Treatments / Results  Labs (all labs ordered are listed, but only abnormal results are displayed) Labs Reviewed - No data to display  EKG  EKG Interpretation None       Radiology No results found.  Procedures Procedures (including critical care time)  Medications Ordered in UC Medications - No data to display   Initial  Impression / Assessment and Plan / UC Course  I have reviewed the triage vital signs and the nursing notes.  Pertinent labs & imaging results that were available during my care of the patient were reviewed by me and considered in my medical decision making (see chart for details).  Clinical Course       Final Clinical Impressions(s) / UC Diagnoses   Final diagnoses:  Cough  Bronchitis    New Prescriptions Discharge Medication List as of  07/20/2016  4:46 PM    START taking these medications   Details  chlorpheniramine-HYDROcodone (TUSSIONEX PENNKINETIC ER) 10-8 MG/5ML SUER Take 5 mLs by mouth every 12 (twelve) hours as needed., Starting Fri 07/20/2016, Normal    sulfamethoxazole-trimethoprim (BACTRIM DS,SEPTRA DS) 800-160 MG tablet Take 1 tablet by mouth 2 (two) times daily., Starting Fri 07/20/2016, Normal       1. diagnosis reviewed with patient 2. rx as per orders above; reviewed possible side effects, interactions, risks and benefits  3. Recommend supportive treatment with rest, fluids 4. Follow-up prn if symptoms worsen or don't improve   Payton Mccallumrlando Orest Dygert, MD 07/20/16 [phone]

## 2016-07-20 NOTE — ED Triage Notes (Signed)
Patient c/o cough and chest congestion for a week.  Patient denies fevers.  °

## 2016-10-18 DIAGNOSIS — H5203 Hypermetropia, bilateral: Secondary | ICD-10-CM | POA: Diagnosis not present

## 2016-10-18 DIAGNOSIS — H52223 Regular astigmatism, bilateral: Secondary | ICD-10-CM | POA: Diagnosis not present

## 2017-02-12 ENCOUNTER — Other Ambulatory Visit: Payer: Self-pay | Admitting: Family Medicine

## 2017-02-12 NOTE — Telephone Encounter (Signed)
Today is last day pt will have ins until new ins starts on 04/15/17. Pt request meds refilled that pharmacy sent for refill request; last annual 02/02/16. No future appt. Refilled per protocol with note pt needs to schedule CPX. Pt notified to ck with pharmacy via v/m.

## 2017-04-15 ENCOUNTER — Telehealth: Payer: Self-pay | Admitting: Family Medicine

## 2017-04-15 NOTE — Telephone Encounter (Signed)
Pt request refill lantus to [facility] [address]. Refill done and pt will cb to schedule appt.

## 2017-04-16 NOTE — Telephone Encounter (Signed)
Pt left v/m that when pt went to [facility] [address] to pick up lantus; pt was told that lantus was not covered by ins. [facility] sent fax to see if substitute insulin covered by ins would be ordered or could Prior auth for lantus to be done. Pt request cb about status of insulin.

## 2017-05-01 NOTE — Telephone Encounter (Signed)
Pt left v/m pt has been without insulin for 2 months; pt request different med sent to [facility]. Pt is not able to make appt with Dr Dayton MartesAron at this time. Pt request cb.

## 2017-05-02 MED ORDER — INSULIN DETEMIR 100 UNIT/ML ~~LOC~~ SOLN: 85.0000 [IU] | Freq: Every day | SUBCUTANEOUS | 11 refills | Status: DC

## 2017-05-02 NOTE — Telephone Encounter (Signed)
It looks like it was refilled. Did he every find out which substitute his insurance prefers?  Please let me know and I will gladly send it in.  I do not want him to be without his rxs.

## 2017-05-02 NOTE — Addendum Note (Signed)
Addended by: Dianne DunARON, TALIA M on: 05/02/2017 12:09 PM   Modules accepted: Orders

## 2017-05-02 NOTE — Telephone Encounter (Signed)
TA-I spoke with pharmacy to get information for insurance that we do not have on file  Per Cover My Meds the following 3 are preferred  Basaglar Kwikpen (Tier 1) Levemir Flextouch (Tier 2) Levemir (Tier 2)  Plz advise/thx dmf

## 2017-05-02 NOTE — Telephone Encounter (Signed)
levemir eRx sent and lantus removed from med list.  Thank you!

## 2017-05-03 NOTE — Telephone Encounter (Signed)
LMOVM stating that alt BS med was sent in to pharm/thx dmf

## 2017-05-06 ENCOUNTER — Ambulatory Visit: Payer: BLUE CROSS/BLUE SHIELD | Admitting: Family Medicine

## 2017-05-20 ENCOUNTER — Ambulatory Visit: Payer: Self-pay | Admitting: Internal Medicine

## 2017-05-23 ENCOUNTER — Encounter: Payer: Self-pay | Admitting: Family Medicine

## 2017-05-23 ENCOUNTER — Ambulatory Visit: Payer: BLUE CROSS/BLUE SHIELD | Admitting: Family Medicine

## 2017-05-23 VITALS — BP 134/84 | HR 64 | Temp 98.2°F | Ht 73.0 in | Wt 246.1 lb

## 2017-05-23 DIAGNOSIS — E785 Hyperlipidemia, unspecified: Secondary | ICD-10-CM

## 2017-05-23 DIAGNOSIS — Z23 Encounter for immunization: Secondary | ICD-10-CM

## 2017-05-23 DIAGNOSIS — E1165 Type 2 diabetes mellitus with hyperglycemia: Secondary | ICD-10-CM | POA: Diagnosis not present

## 2017-05-23 DIAGNOSIS — I10 Essential (primary) hypertension: Secondary | ICD-10-CM | POA: Diagnosis not present

## 2017-05-23 LAB — LIPID PANEL
CHOL/HDL RATIO: 4
Cholesterol: 183 mg/dL (ref 0–200)
HDL: 40.9 mg/dL (ref >39.00)
LDL Cholesterol: 129 mg/dL — ABNORMAL HIGH (ref 0–99)
NONHDL: 142.49
TRIGLYCERIDES: 67 mg/dL (ref 0.0–149.0)
VLDL: 13.4 mg/dL (ref 0.0–40.0)

## 2017-05-23 LAB — COMPREHENSIVE METABOLIC PANEL
ALBUMIN: 4.3 g/dL (ref 3.5–5.2)
ALT: 51 U/L (ref 0–53)
AST: 31 U/L (ref 0–37)
Alkaline Phosphatase: 47 U/L (ref 39–117)
BILIRUBIN TOTAL: 0.5 mg/dL (ref 0.2–1.2)
BUN: 16 mg/dL (ref 6–23)
CO2: 31 mEq/L (ref 19–32)
CREATININE: 0.9 mg/dL (ref 0.40–1.50)
Calcium: 9.8 mg/dL (ref 8.4–10.5)
Chloride: 101 mEq/L (ref 96–112)
GFR: 121.14 mL/min (ref >60.00)
Glucose, Bld: 161 mg/dL — ABNORMAL HIGH (ref 70–99)
Potassium: 4.2 mEq/L (ref 3.5–5.1)
SODIUM: 139 meq/L (ref 135–145)
TOTAL PROTEIN: 7.6 g/dL (ref 6.0–8.3)

## 2017-05-23 LAB — HEMOGLOBIN A1C: Hgb A1c MFr Bld: 11 % — ABNORMAL HIGH (ref 4.6–6.5)

## 2017-05-23 MED ORDER — GLYBURIDE 1.25 MG PO TABS: ORAL_TABLET | ORAL | 1 refills | Status: DC

## 2017-05-23 MED ORDER — METOPROLOL TARTRATE 50 MG PO TABS: ORAL_TABLET | ORAL | 1 refills | Status: DC

## 2017-05-23 MED ORDER — INSULIN DETEMIR 100 UNIT/ML ~~LOC~~ SOLN: 85.0000 [IU] | Freq: Every day | SUBCUTANEOUS | 11 refills | Status: DC

## 2017-05-23 MED ORDER — METFORMIN HCL 500 MG PO TABS: 500.0000 mg | ORAL_TABLET | Freq: Two times a day (BID) | ORAL | 1 refills | Status: DC

## 2017-05-23 MED ORDER — AMLODIPINE BESYLATE 10 MG PO TABS: ORAL_TABLET | ORAL | 1 refills | Status: DC

## 2017-05-23 MED ORDER — LOSARTAN POTASSIUM-HCTZ 100-25 MG PO TABS: 1.0000 | ORAL_TABLET | Freq: Every day | ORAL | 1 refills | Status: DC

## 2017-05-23 NOTE — Progress Notes (Signed)
Subjective:    Patient ID: Travis Scott, male    DOB: 1981/05/09, 36 y.o.   MRN: [id number]  Pleasant 36 yo male here for follow up.  DM- diagnosed in 2004.  Referred him to endocrinology but he was lost to follow up.   Recently had to change diabetes rxs due to insurance coverage.  Currently taking Metformin 500 mg twice daily and glyburide 1.25 mg daily, and Levemir 85 units qhs. Was taking Metformin 1000 mg twice daily but couldn't tolerate GI side effects.   Checks his FSBS once or twice weekly.  Due for labs. Fasting FSBS 77 this morning. Lab Results  Component Value Date   HGBA1C 8.2 (H) 02/02/2016   Still going to the gym, weight stable.  Pneumovax given on 12/28/14.  Wt Readings from Last 3 Encounters:  05/23/17 246 lb 1.9 oz (111.6 kg)  07/20/16 250 lb (113.4 kg)  06/19/16 250 lb (113.4 kg)    HLD-  Not quite at goal for a diabetic. He is taking lopid but not a statin. Due for labs. Lab Results  Component Value Date   CHOL 213 (H) 02/02/2016   HDL 49.10 02/02/2016   LDLCALC 139 (H) 02/02/2016   LDLDIRECT 191.3 04/10/2011   TRIG 126.0 02/02/2016   CHOLHDL 4 02/02/2016   Wt Readings from Last 3 Encounters:  05/23/17 246 lb 1.9 oz (111.6 kg)  07/20/16 250 lb (113.4 kg)  06/19/16 250 lb (113.4 kg)    HTN- diagnosed as a teenager. On Metoprolol 50 mg twice daily, losartan/hctz 100/25 mg daily, and amlodipine 10 mg daily.  No CP, SOB or blurred vision. Lab Results  Component Value Date   CREATININE 0.82 02/02/2016   Patient Active Problem List   Diagnosis Date Noted  . Obesity 04/15/2014  . Hypoglycemia associated with type 2 diabetes mellitus (HCC) 04/15/2014  . Paronychia of third finger of right hand 11/16/2013  . Tobacco abuse 09/07/2013  . Diabetic neuropathy (HCC) 08/14/2012  . Low testosterone 08/14/2012  . Erectile dysfunction 08/14/2012  . Diabetes mellitus out of control (HCC) 12/06/2011  . Fatigue 04/10/2011  . Hyperlipidemia  04/10/2011  . Hypertension    Past Medical History:  Diagnosis Date  . Chronic back pain   . Diabetes mellitus   . Hypertension   . Kidney stones    Past Surgical History:  Procedure Laterality Date  . NO PAST SURGERIES     Social History   Tobacco Use  . Smoking status: Former Games developermoker  . Smokeless tobacco: Never Used  Substance Use Topics  . Alcohol use: No  . Drug use: No   Family History  Problem Relation Age of Onset  . Hypertension Mother   . Diabetes Mother   . Diabetes Father   . Hypertension Father    No Known Allergies   Current Outpatient Medications:  .  amLODipine (NORVASC) 10 MG tablet, TAKE 1 TABLET(10 MG) BY MOUTH DAILY, Disp: 90 tablet, Rfl: 1 .  gemfibrozil (LOPID) 600 MG tablet, TAKE 1 TABLET BY MOUTH TWICE DAILY, Disp: 180 tablet, Rfl: 0 .  glyBURIDE (DIABETA) 1.25 MG tablet, TAKE 1 TABLET(1.25 MG) BY MOUTH DAILY WITH BREAKFAST, Disp: 90 tablet, Rfl: 1 .  ibuprofen (ADVIL,MOTRIN) 800 MG tablet, Take 1 tablet (800 mg total) by mouth every 8 (eight) hours as needed., Disp: 30 tablet, Rfl: 0 .  insulin detemir (LEVEMIR) 100 UNIT/ML injection, Inject 0.85 mLs (85 Units total) at bedtime into the skin., Disp: 25 mL, Rfl: 11 .  losartan-hydrochlorothiazide (HYZAAR) 100-25 MG tablet, Take 1 tablet daily by mouth., Disp: 90 tablet, Rfl: 1 .  metFORMIN (GLUCOPHAGE) 500 MG tablet, Take 1 tablet (500 mg total) 2 (two) times daily by mouth., Disp: 180 tablet, Rfl: 1 .  metoprolol tartrate (LOPRESSOR) 50 MG tablet, TAKE 1 TABLET(50 MG) BY MOUTH TWICE DAILY, Disp: 180 tablet, Rfl: 1  The PMH, PSH, Social History, Family History, Medications, and allergies [facility], and have been updated if relevant.   Review of Systems Review of Systems  Constitutional: Negative.   HENT: Negative.   Eyes: Negative.   Respiratory: Negative.   Cardiovascular: Negative.   Gastrointestinal: Negative.   Endocrine: Negative.   Musculoskeletal: Negative.   Skin:  Negative.   Allergic/Immunologic: Negative.   Neurological: Negative.   Hematological: Negative.   Psychiatric/Behavioral: Negative.   All other systems reviewed and are negative.      Objective:   Physical Exam BP 134/84 (BP Location: Right Arm, Patient Position: Sitting, Cuff Size: Normal)   Pulse 64   Temp 98.2 F (36.8 C) (Oral)   Ht 6\' 1"  (1.854 m)   Wt 246 lb 1.9 oz (111.6 kg)   SpO2 97%   BMI 32.47 kg/m   General:  pleasant male in no acute distress Eyes:  PERRL Ears:  External ear exam shows no significant lesions or deformities.  TMs normal bilaterally Hearing is grossly normal bilaterally. Nose:  External nasal examination shows no deformity or inflammation. Nasal mucosa are pink and moist without lesions or exudates. Mouth:  Oral mucosa and oropharynx without lesions or exudates.  Teeth in good repair. Neck:  no carotid bruit or thyromegaly no cervical or supraclavicular lymphadenopathy  Lungs:  Normal respiratory effort, chest expands symmetrically. Lungs are clear to auscultation, no crackles or wheezes. Heart:  Normal rate and regular rhythm. S1 and S2 normal without gallop, murmur, click, rub or other extra sounds. Abdomen:  Bowel sounds positive,abdomen soft and non-tender without masses, organomegaly or hernias noted. Pulses:  R and L posterior tibial pulses are full and equal bilaterally  Extremities:  no edema  Psych:  Good eye contact, not anxious or depressed appearing        Assessment & Plan:

## 2017-05-23 NOTE — Patient Instructions (Signed)
Great to see you. We will call you with your lab results and you can view them online.  

## 2017-05-23 NOTE — Assessment & Plan Note (Signed)
Continue current rxs.  Check labs today. 

## 2017-05-23 NOTE — Assessment & Plan Note (Signed)
No changes made today. Check a1c today.

## 2017-05-23 NOTE — Assessment & Plan Note (Signed)
At goal for diabetic. No changes made to rxs. 

## 2017-07-14 ENCOUNTER — Other Ambulatory Visit: Payer: Self-pay | Admitting: Family Medicine

## 2017-08-30 ENCOUNTER — Ambulatory Visit: Payer: BLUE CROSS/BLUE SHIELD | Admitting: Family Medicine

## 2017-08-30 ENCOUNTER — Encounter: Payer: Self-pay | Admitting: Family Medicine

## 2017-08-30 VITALS — BP 144/82 | HR 75 | Temp 98.4°F | Wt 252.0 lb

## 2017-08-30 DIAGNOSIS — E1165 Type 2 diabetes mellitus with hyperglycemia: Secondary | ICD-10-CM | POA: Diagnosis not present

## 2017-08-30 DIAGNOSIS — L0291 Cutaneous abscess, unspecified: Secondary | ICD-10-CM

## 2017-08-30 MED ORDER — DOXYCYCLINE HYCLATE 100 MG PO CAPS: 100.0000 mg | ORAL_CAPSULE | Freq: Two times a day (BID) | ORAL | 0 refills | Status: DC

## 2017-08-30 NOTE — Progress Notes (Signed)
   Subjective:    Patient ID: Travis Scott, male    DOB: 1980/09/15, 37 y.o.   MRN: [id number]  HPI This is a 37 yo male who presents today with boil under his left buttock x 1 week. Painful. No fever or chills, no drainage. Has had similar episode on same side many years ago that required I&D.   He has history of elevated hemoglobin A1C- over 11 at last OV 11/18. At that time, he had been off his Levamir insulin for several months. He is currently taking gliburide, glucophage, levemir 85 units nightly. Has been checking blood sugars and they have been running 75-160. He has resumed exercising. 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   Past Medical History:  Diagnosis Date  . Chronic back pain   . Diabetes mellitus   . Hypertension   . Kidney stones    Past Surgical History:  Procedure Laterality Date  . NO PAST SURGERIES     Family History  Problem Relation Age of Onset  . Hypertension Mother   . Diabetes Mother   . Diabetes Father   . Hypertension Father    Social History   Tobacco Use  . Smoking status: Former Games developermoker  . Smokeless tobacco: Never Used  Substance Use Topics  . Alcohol use: No  . Drug use: No      Review of Systems Per HPI    Objective:   Physical Exam  Constitutional: He is oriented to person, place, and time. He appears well-developed and well-nourished. No distress.  Cardiovascular: Normal rate.  Pulmonary/Chest: Effort normal.  Abdominal: Soft. Bowel sounds are normal.  Neurological: He is alert and oriented to person, place, and time.  Skin: Skin is warm and dry. He is not diaphoretic.  Left upper, lateral thigh with 3 cm, raised, fluctuant abscess.    Psychiatric: He has a normal mood and affect. His behavior is normal. Judgment and thought content normal.  Vitals reviewed.     BP (!) 144/82   Pulse 75   Temp 98.4 F (36.9 C) (Oral)   Wt 252 lb (114.3 kg)   SpO2 99%   BMI  33.25 kg/m  Wt Readings from Last 3 Encounters:  08/30/17 252 lb (114.3 kg)  05/23/17 246 lb 1.9 oz (111.6 kg)  07/20/16 250 lb (113.4 kg)   Procedure- VCO, area cleaned with betadine and alcohol. Area numbed with lidocaine 2% with epi 3cc. Incision made with scalpel and large amount purulent material expressed. Explored with pick ups and 1/4 " packing placed. Wound dressed. Patient tolerated well.     Assessment & Plan:  1. Abscess - Provided written and verbal information regarding diagnosis and treatment. - RTC/ER/UC precautions for weekend reviewed - will follow up in 3 days (Monday) for wound check- will get fasting labs at that time - doxycycline (VIBRAMYCIN) 100 MG capsule; Take 1 capsule (100 mg total) by mouth 2 (two) times daily.  Dispense: 14 capsule; Refill: 0 - WOUND CULTURE   Olean Reeeborah Gessner, FNP-BC  [facility], [facility]  08/30/2017 9:02 AM

## 2017-08-30 NOTE — Patient Instructions (Signed)
Leave on dressing for 24 hours, apply heat (hot cloth in zip lock baggie) 3-4 times a day After 24 hours, change dressing, try not to disturb packing, but don't worry if it falls out Change daily, keep clean and dry   Skin Abscess A skin abscess is an infected area on or under your skin that contains a collection of pus and other material. An abscess may also be called a furuncle, carbuncle, or boil. An abscess can occur in or on almost any part of your body. Some abscesses break open (rupture) on their own. Most continue to get worse unless they are treated. The infection can spread deeper into the body and eventually into your blood, which can make you feel ill. Treatment usually involves draining the abscess. What are the causes? An abscess occurs when germs, often bacteria, pass through your skin and cause an infection. This may be caused by:  A scrape or cut on your skin.  A puncture wound through your skin, including a needle injection.  Blocked oil or sweat glands.  Blocked and infected hair follicles.  A cyst that forms beneath your skin (sebaceous cyst) and becomes infected.  What increases the risk? This condition is more likely to develop in people who:  Have a weak body defense system (immune system).  Have diabetes.  Have dry and irritated skin.  Get frequent injections or use illegal IV drugs.  Have a foreign body in a wound, such as a splinter.  Have problems with their lymph system or veins.  What are the signs or symptoms? An abscess may start as a painful, firm bump under the skin. Over time, the abscess may get larger or become softer. Pus may appear at the top of the abscess, causing pressure and pain. It may eventually break through the skin and drain. Other symptoms include:  Redness.  Warmth.  Swelling.  Tenderness.  A sore on the skin.  How is this diagnosed? This condition is diagnosed based on your medical history and a physical exam. A  sample of pus may be taken from the abscess to find out what is causing the infection and what antibiotics can be used to treat it. You also may have:  Blood tests to look for signs of infection or spread of an infection to your blood.  Imaging studies such as ultrasound, CT scan, or MRI if the abscess is deep.  How is this treated? Small abscesses that drain on their own may not need treatment. Treatment for an abscess that does not rupture on its own may include:  Warm compresses applied to the area several times per day.  Incision and drainage. Your health care provider will make an incision to open the abscess and will remove pus and any foreign body or dead tissue. The incision area may be packed with gauze to keep it open for a few days while it heals.  Antibiotic medicines to treat infection. For a severe abscess, you may first get antibiotics through an IV and then change to oral antibiotics.  Follow these instructions at home: Abscess Care  If you have an abscess that has not drained, place a warm, clean, wet washcloth over the abscess several times a day. Do this as told by your health care provider.  Follow instructions from your health care provider about how to take care of your abscess. Make sure you: ? Cover the abscess with a bandage (dressing). ? Change your dressing or gauze as told by your  health care provider. ? Wash your hands with soap and water before you change the dressing or gauze. If soap and water are not available, use hand sanitizer.  Check your abscess every day for signs of a worsening infection. Check for: ? More redness, swelling, or pain. ? More fluid or blood. ? Warmth. ? More pus or a bad smell. Medicines  Take over-the-counter and prescription medicines only as told by your health care provider.  If you were prescribed an antibiotic medicine, take it as told by your health care provider. Do not stop taking the antibiotic even if you start to  feel better. General instructions  To avoid spreading the infection: ? Do not share personal care items, towels, or hot tubs with others. ? Avoid making skin contact with other people.  Keep all follow-up visits as told by your health care provider. This is important. Contact a health care provider if:  You have more redness, swelling, or pain around your abscess.  You have more fluid or blood coming from your abscess.  Your abscess feels warm to the touch.  You have more pus or a bad smell coming from your abscess.  You have a fever.  You have muscle aches.  You have chills or a general ill feeling. Get help right away if:  You have severe pain.  You see red streaks on your skin spreading away from the abscess. This information is not intended to replace advice given to you by your health care provider. Make sure you discuss any questions you have with your health care provider. Document Released: 04/11/2005 Document Revised: 02/26/2016 Document Reviewed: 05/11/2015 Elsevier Interactive Patient Education  [employer].

## 2017-09-02 ENCOUNTER — Encounter: Payer: Self-pay | Admitting: Family Medicine

## 2017-09-02 ENCOUNTER — Ambulatory Visit: Payer: BLUE CROSS/BLUE SHIELD | Admitting: Family Medicine

## 2017-09-02 VITALS — BP 124/84 | HR 74

## 2017-09-02 DIAGNOSIS — L0291 Cutaneous abscess, unspecified: Secondary | ICD-10-CM

## 2017-09-02 DIAGNOSIS — E1165 Type 2 diabetes mellitus with hyperglycemia: Secondary | ICD-10-CM

## 2017-09-02 LAB — HEMOGLOBIN A1C: Hgb A1c MFr Bld: 8.9 % — ABNORMAL HIGH (ref 4.6–6.5)

## 2017-09-02 LAB — BASIC METABOLIC PANEL
BUN: 14 mg/dL (ref 6–23)
CALCIUM: 9.2 mg/dL (ref 8.4–10.5)
CO2: 31 meq/L (ref 19–32)
CREATININE: 0.85 mg/dL (ref 0.40–1.50)
Chloride: 102 mEq/L (ref 96–112)
GFR: 129.21 mL/min (ref >60.00)
Glucose, Bld: 164 mg/dL — ABNORMAL HIGH (ref 70–99)
Potassium: 4.1 mEq/L (ref 3.5–5.1)
Sodium: 140 mEq/L (ref 135–145)

## 2017-09-02 NOTE — Progress Notes (Signed)
   Subjective:    Patient ID: Travis Scott, male    DOB: 1981/01/26, 37 y.o.   MRN: [id number]  HPI This is a 37 yo male who presents today for wound check. Had I & D of left upper thigh abscess 3 days ago. Tolerating antibiotic, no fever/chills, decreased pain following I & D.   Past Medical History:  Diagnosis Date  . Chronic back pain   . Diabetes mellitus   . Hypertension   . Kidney stones    Past Surgical History:  Procedure Laterality Date  . NO PAST SURGERIES     Family History  Problem Relation Age of Onset  . Hypertension Mother   . Diabetes Mother   . Diabetes Father   . Hypertension Father    Social History   Tobacco Use  . Smoking status: Former Games developermoker  . Smokeless tobacco: Never Used  Substance Use Topics  . Alcohol use: No  . Drug use: No      Review of Systems Per HPI    Objective:   Physical Exam Dressing and packing removed, small amount purulent drainage expressed, no erythema, mild tenderness to palpation. Irrigated with 3 cc lidocaine 2% and dressing applied.   BP 124/84 (BP Location: Left Arm, Patient Position: Sitting, Cuff Size: Large)   Pulse 74      Assessment & Plan:  1. Abscess - improved following I & D and antibiotic, still awaiting culture. - Reviewed wound care instructions- clean daily with soap and water, dry thoroughly, keep covered while wound open - follow up PRN   Olean Reeeborah Monzerrat Wellen, FNP-BC  [facility], [facility]  09/02/2017 8:11 AM

## 2017-09-02 NOTE — Addendum Note (Signed)
Addended by: Alvina ChouWALSH, TERRI J on: 09/02/2017 08:15 AM   Modules accepted: Orders

## 2017-09-03 LAB — WOUND CULTURE
MICRO NUMBER:: 90205516
SPECIMEN QUALITY:: ADEQUATE

## 2017-09-12 ENCOUNTER — Telehealth: Payer: Self-pay | Admitting: Family Medicine

## 2017-09-12 NOTE — Telephone Encounter (Addendum)
Attempted to call pt to give lab results. No answer at this time. Also documented in result note.

## 2017-09-12 NOTE — Telephone Encounter (Signed)
Copied from CRM [phone]. Topic: Quick Communication - See Telephone Encounter >> Sep 12, 2017  9:06 AM Waymon AmatoBurton, Donna F wrote: CRM for notification. See Telephone encounter for:  Pt is retuning call regarding lab work which is ok for [facility] to give results    09/12/17.

## 2017-09-13 ENCOUNTER — Emergency Department: Payer: BLUE CROSS/BLUE SHIELD

## 2017-09-13 ENCOUNTER — Encounter: Payer: Self-pay | Admitting: Emergency Medicine

## 2017-09-13 ENCOUNTER — Emergency Department
Admission: EM | Admit: 2017-09-13 | Discharge: 2017-09-14 | Disposition: A | Discharge status: Home / Self Care | Payer: BLUE CROSS/BLUE SHIELD | Attending: Emergency Medicine | Admitting: Emergency Medicine

## 2017-09-13 DIAGNOSIS — L0291 Cutaneous abscess, unspecified: Secondary | ICD-10-CM | POA: Diagnosis not present

## 2017-09-13 DIAGNOSIS — Z794 Long term (current) use of insulin: Secondary | ICD-10-CM | POA: Diagnosis not present

## 2017-09-13 DIAGNOSIS — Z79899 Other long term (current) drug therapy: Secondary | ICD-10-CM | POA: Diagnosis not present

## 2017-09-13 DIAGNOSIS — Z5189 Encounter for other specified aftercare: Secondary | ICD-10-CM

## 2017-09-13 DIAGNOSIS — I10 Essential (primary) hypertension: Secondary | ICD-10-CM | POA: Insufficient documentation

## 2017-09-13 DIAGNOSIS — R1909 Other intra-abdominal and pelvic swelling, mass and lump: Secondary | ICD-10-CM | POA: Diagnosis not present

## 2017-09-13 DIAGNOSIS — L0231 Cutaneous abscess of buttock: Secondary | ICD-10-CM | POA: Diagnosis not present

## 2017-09-13 DIAGNOSIS — E1140 Type 2 diabetes mellitus with diabetic neuropathy, unspecified: Secondary | ICD-10-CM | POA: Insufficient documentation

## 2017-09-13 DIAGNOSIS — Z87891 Personal history of nicotine dependence: Secondary | ICD-10-CM | POA: Diagnosis not present

## 2017-09-13 NOTE — ED Triage Notes (Signed)
Pt comes into the ED via POV c/o an abscess that was lanced open and he is now needing a wound check.  Patient states that it was lanced, packed, and he now believes the wound has closed at the top over the packing.  He explains that he can no longer see the "tail" of the packing.  Denies any fevers and patient is ambulatory into triage at this time.

## 2017-09-13 NOTE — ED Provider Notes (Signed)
[facility] Emergency Department Provider Note   ____________________________________________   First MD Initiated Contact with Patient 09/13/17 2323     (approximate)  I have reviewed the triage vital signs and the nursing notes.   HISTORY  Chief Complaint Wound Check    HPI Travis Scott is a 37 y.o. male who presents to the ED from home with a chief complaint of wound check.  Patient had I&D of left buttock abscess at his PCPs office on 2/15.  Wound was packed and he was started on doxycycline.  He returned to the office on 2/18 for a wound check.  Per note in the computer system, packing was removed at that time.  Patient was showering tonight, felt like that area was swollen again, "squeezed it" and saw some purulent-like material.  He was not aware that the packing had been removed and was concerned that his wound had grown over the packing.  Finished his antibiotics and had not experienced fever, chills, chest pain, shortness of breath, abdominal pain, nausea, vomiting.   Past Medical History:  Diagnosis Date  . Chronic back pain   . Diabetes mellitus   . Hypertension   . Kidney stones     Patient Active Problem List   Diagnosis Date Noted  . Obesity 04/15/2014  . Hypoglycemia associated with type 2 diabetes mellitus (HCC) 04/15/2014  . Paronychia of third finger of right hand 11/16/2013  . Tobacco abuse 09/07/2013  . Diabetic neuropathy (HCC) 08/14/2012  . Low testosterone 08/14/2012  . Erectile dysfunction 08/14/2012  . Diabetes mellitus out of control (HCC) 12/06/2011  . Fatigue 04/10/2011  . Hyperlipidemia 04/10/2011  . Hypertension   . Esophageal reflux 07/25/2010    Past Surgical History:  Procedure Laterality Date  . NO PAST SURGERIES      Prior to Admission medications   Medication Sig Start Date End Date Taking? Authorizing Provider  amLODipine (NORVASC) 10 MG tablet TAKE 1 TABLET(10 MG) BY MOUTH DAILY 05/23/17   Dianne Dun, MD  clindamycin (CLEOCIN) 300 MG capsule Take 1 capsule (300 mg total) by mouth 3 (three) times daily. 09/14/17   Irean Hong, MD  doxycycline (VIBRAMYCIN) 100 MG capsule Take 1 capsule (100 mg total) by mouth 2 (two) times daily. 08/30/17   Emi Belfast, FNP  gemfibrozil (LOPID) 600 MG tablet TAKE 1 TABLET BY MOUTH TWICE DAILY 02/12/17   Dianne Dun, MD  glyBURIDE (DIABETA) 1.25 MG tablet TAKE 1 TABLET(1.25 MG) BY MOUTH DAILY WITH BREAKFAST 05/23/17   Dianne Dun, MD  ibuprofen (ADVIL,MOTRIN) 800 MG tablet Take 1 tablet (800 mg total) by mouth every 8 (eight) hours as needed for moderate pain. 09/14/17   Irean Hong, MD  insulin detemir (LEVEMIR) 100 UNIT/ML injection Inject 0.85 mLs (85 Units total) at bedtime into the skin. 05/23/17   Dianne Dun, MD  losartan-hydrochlorothiazide [place]) 100-25 MG tablet TAKE 1 TABLET BY MOUTH DAILY 07/15/17   Dianne Dun, MD  metFORMIN (GLUCOPHAGE) 500 MG tablet Take 1 tablet (500 mg total) 2 (two) times daily by mouth. 05/23/17   Dianne Dun, MD  metoprolol tartrate (LOPRESSOR) 50 MG tablet TAKE 1 TABLET(50 MG) BY MOUTH TWICE DAILY 05/23/17   Dianne Dun, MD  oxyCODONE-acetaminophen (PERCOCET/ROXICET) 5-325 MG tablet Take 1 tablet by mouth every 4 (four) hours as needed for severe pain. 09/14/17   Irean Hong, MD    Allergies Patient has no known allergies.  Family History  Problem Relation Age of Onset  . Hypertension Mother   . Diabetes Mother   . Diabetes Father   . Hypertension Father     Social History Social History   Tobacco Use  . Smoking status: Former Games developermoker  . Smokeless tobacco: Never Used  Substance Use Topics  . Alcohol use: No  . Drug use: No    Review of Systems  Constitutional: No fever/chills. Eyes: No visual changes. ENT: No sore throat. Cardiovascular: Denies chest pain. Respiratory: Denies shortness of breath. Gastrointestinal: No abdominal pain.  No nausea, no vomiting.  No diarrhea.  No  constipation. Genitourinary: Negative for dysuria. Musculoskeletal: Negative for back pain. Skin: Positive for healing wound.  Negative for rash. Neurological: Negative for headaches, focal weakness or numbness.   ____________________________________________   PHYSICAL EXAM:  VITAL SIGNS: ED Triage Vitals  Enc Vitals Group     BP 09/13/17 2243 (!) 159/85     Pulse Rate 09/13/17 2243 75     Resp 09/13/17 2243 17     Temp 09/13/17 2243 98.2 F (36.8 C)     Temp Source 09/13/17 2243 Oral     SpO2 09/13/17 2243 98 %     Weight 09/13/17 2242 252 lb (114.3 kg)     Height 09/13/17 2242 6\' 1"  (1.854 m)     Head Circumference --      Peak Flow --      Pain Score --      Pain Loc --      Pain Edu? --      Excl. in GC? --     Constitutional: Alert and oriented. Well appearing and in no acute distress. Eyes: Conjunctivae are normal. PERRL. EOMI. Head: Atraumatic. Nose: No congestion/rhinnorhea. Mouth/Throat: Mucous membranes are moist.  Oropharynx non-erythematous. Neck: No stridor.   Cardiovascular: Normal rate, regular rhythm. Grossly normal heart sounds.  Good peripheral circulation. Respiratory: Normal respiratory effort.  No retractions. Lungs CTAB. Gastrointestinal: Soft and nontender. No distention. No abdominal bruits. No CVA tenderness. Musculoskeletal: No lower extremity tenderness nor edema.  No joint effusions. Neurologic:  Normal speech and language. No gross focal neurologic deficits are appreciated. No gait instability. Skin: Area of I&D to left lateral thigh/buttock area C/D/I.  Mild swelling noted without warmth, erythema or fluctuance.  No purulence expressed from well-healed incision site.  Skin is warm, dry and intact. No rash noted. Psychiatric: Mood and affect are normal. Speech and behavior are normal.  ____________________________________________   LABS (all labs ordered are listed, but only abnormal results are displayed)  Labs Reviewed - No data to  display ____________________________________________  EKG  None ____________________________________________  RADIOLOGY  ED MD interpretation: Abscess  Official radiology report(s): [place] Pelvis Limited  Result Date: 09/14/2017 CLINICAL DATA:  The patient underwent incision and drainage of a pole under his left buttock 08/30/2017. Now with mild swelling at the area of incision. Pain and erythema. EXAM: LIMITED ULTRASOUND OF PELVIS TECHNIQUE: Limited transabdominal ultrasound examination of the pelvis was performed. COMPARISON:  None. FINDINGS: Scanning was directed toward the region of concern at the lower left buttock. A well-defined abnormal area measuring 2.8 x 1.1 x 3.1 cm is identified in the shallow subcutaneous tissues. Echogenic material is present within the area. IMPRESSION: Findings most consistent with recurrent abscess in the region of concern. Echogenic material within the collection is likely complex fluid. There may be packing present. Electronically Signed   By: Drusilla Kannerhomas  Dalessio M.D.   On: 09/14/2017 00:27  ____________________________________________   PROCEDURES  Procedure(s) performed:     Marland KitchenMarland KitchenIncision and Drainage Date/Time: 09/14/2017 1:51 AM Performed by: Irean Hong, MD Authorized by: Irean Hong, MD   Consent:    Consent obtained:  Verbal   Consent given by:  Patient   Risks discussed:  Bleeding, infection, incomplete drainage and pain Location:    Type:  Abscess   Location:  Lower extremity   Lower extremity location:  Buttock   Buttock location:  L buttock Pre-procedure details:    Skin preparation:  Betadine Anesthesia (see MAR for exact dosages):    Anesthesia method:  Local infiltration   Local anesthetic:  Lidocaine 1% WITH epi Procedure type:    Complexity:  Complex Procedure details:    Incision types:  Single straight   Incision depth:  Subcutaneous   Scalpel blade:  11   Wound management:  Probed and deloculated, irrigated with saline  and extensive cleaning   Drainage:  Purulent   Drainage amount:  Moderate   Wound treatment:  Wound left open   Packing materials:  1/4 in iodoform gauze Post-procedure details:    Patient tolerance of procedure:  Tolerated well, no immediate complications Comments:     No pre-existing packing material visualized during procedure     Critical Care performed: No  ____________________________________________   INITIAL IMPRESSION / ASSESSMENT AND PLAN / ED COURSE  As part of my medical decision making, I reviewed the following data within the electronic MEDICAL RECORD NUMBER Nursing notes reviewed and incorporated, Labs reviewed, Old chart reviewed, Radiograph reviewed  and Notes from prior ED visits.   37 year old male who presents for wound check status post I&D of left posterior thigh/buttock abscess on 2/15 by his PCP.  It is documented in his records that the packing was removed on 2/18 on his follow-up visit.  Area in question does not appear erythematous, warm or fluctuant.  There is mild swelling; unclear if this was present prior to patient noticing it in the shower tonight.  Patient does not exhibit constitutional symptoms such as fever, chills, myalgias, nausea or vomiting.  Will obtain soft tissue ultrasound to examine area in question for abscess.  Clinical Course as of Sep 15 255  Sat Sep 14, 2017  0153 Updated patient of imaging results.  Patient tolerated I&D well.  Made a longer incision adjacent to existing one.  Use hemostats to probe and break up loculations.  Cheesy purulent material obtained.  Cavity irrigated extensively with saline.  Packing applied.  Will discharge patient on clindamycin.  He is to have wound check either here or with his PCP in 2 days.  Will refer to surgery for follow-up.  Strict return precautions given.  Patient verbalizes understanding and agrees with plan of care.  [JS]    Clinical Course User Index [JS] Irean Hong, MD      ____________________________________________   FINAL CLINICAL IMPRESSION(S) / ED DIAGNOSES  Final diagnoses:  Wound check, abscess  Abscess     ED Discharge Orders        Ordered    clindamycin (CLEOCIN) 300 MG capsule  3 times daily     09/14/17 0155    ibuprofen (ADVIL,MOTRIN) 800 MG tablet  Every 8 hours PRN     09/14/17 0155    oxyCODONE-acetaminophen (PERCOCET/ROXICET) 5-325 MG tablet  Every 4 hours PRN     09/14/17 0155       Note:  This document was prepared using Dragon voice recognition software  and may include unintentional dictation errors.    Irean Hong, MD 09/14/17 [phone]

## 2017-09-14 DIAGNOSIS — R1909 Other intra-abdominal and pelvic swelling, mass and lump: Secondary | ICD-10-CM | POA: Diagnosis not present

## 2017-09-14 MED ORDER — IBUPROFEN 800 MG PO TABS: 800.0000 mg | ORAL_TABLET | Freq: Three times a day (TID) | ORAL | 0 refills | Status: DC | PRN

## 2017-09-14 MED ORDER — CLINDAMYCIN HCL 300 MG PO CAPS: 300.0000 mg | ORAL_CAPSULE | Freq: Three times a day (TID) | ORAL | 0 refills | Status: DC

## 2017-09-14 MED ORDER — CLINDAMYCIN HCL 150 MG PO CAPS
300.0000 mg | ORAL_CAPSULE | Freq: Once | ORAL | Status: AC
  Administered 2017-09-14: 300 mg via ORAL
  Filled 2017-09-14: qty 2

## 2017-09-14 MED ORDER — LIDOCAINE-EPINEPHRINE (PF) 2 %-1:200000 IJ SOLN
10.0000 mL | Freq: Once | INTRAMUSCULAR | Status: AC
  Administered 2017-09-14: 10 mL via INTRADERMAL
  Filled 2017-09-14: qty 10

## 2017-09-14 MED ORDER — OXYCODONE-ACETAMINOPHEN 5-325 MG PO TABS: 1.0000 | ORAL_TABLET | ORAL | 0 refills | Status: DC | PRN

## 2017-09-14 NOTE — ED Notes (Signed)
Patient in US.

## 2017-09-14 NOTE — ED Notes (Signed)
ED Provider at bedside. 

## 2017-09-14 NOTE — Discharge Instructions (Signed)
1. Your ultrasound showed recurrent abscess so we opened the area back up, squeezed the pus out and applied packing material.   2. Do not remove packing material yourself.   3. See your doctor or return to the ER in 2 days for wound check.  Please bring the packing material provided to you tonight.   4. Start antibiotic as prescribed (clindamycin 300 mg 3 times daily for 10 days).   5. You may take pain medicine as needed (Motrin/Percocet #15).   6. Return sooner for worsening symptoms, increased redness/swelling, fever or other concerns.

## 2017-09-17 ENCOUNTER — Ambulatory Visit: Payer: BLUE CROSS/BLUE SHIELD | Admitting: Family Medicine

## 2017-09-17 ENCOUNTER — Other Ambulatory Visit: Payer: Self-pay | Admitting: Family Medicine

## 2017-09-17 ENCOUNTER — Encounter: Payer: Self-pay | Admitting: Family Medicine

## 2017-09-17 VITALS — BP 132/76 | HR 63 | Temp 98.9°F | Ht 73.0 in | Wt 260.8 lb

## 2017-09-17 DIAGNOSIS — E1165 Type 2 diabetes mellitus with hyperglycemia: Secondary | ICD-10-CM

## 2017-09-17 DIAGNOSIS — E785 Hyperlipidemia, unspecified: Secondary | ICD-10-CM

## 2017-09-17 DIAGNOSIS — L0291 Cutaneous abscess, unspecified: Secondary | ICD-10-CM

## 2017-09-17 NOTE — Patient Instructions (Addendum)
Great to see you. Please change your dressing daily.

## 2017-09-17 NOTE — Progress Notes (Signed)
Subjective:   Patient ID: Travis Scott, male    DOB: 08/19/1980, 37 y.o.   MRN: [id number]  Travis Scott is a pleasant 37 y.o. year old male who presents to clinic today with Hospitalization Follow-up (Patient is here today for a F/U from ED visit for wound check from 3.1.19.  On 2.15.19 he had I&D of Left Posterior thigh/buttock abscess by Olean Ree, FNP.  He states that the site is still draining and sore.) and Nasal Congestion (Patient is also C/O nasal congestion that started on 3.4.19.  States that it is stuffy but denies any pressure.  Post-nasal drip, intermittent proctive cough with clear to yellow sputum. Denies any ear pain.  )  on 09/17/2017  HPI:  ED notes and office notes reviewed.  Thigh/buttocks abscess- initially saw Deboraha Sprang for this complaint on 08/30/17. I and D performed and wound packed.  Sent home on oral doxycyline.  Returned on 09/02/17 for wound recheck with Debbie. Packing removed and felt he was improving. Wound cx from 08/30/17 grew pan sensitive staph.  Went to ED on 09/13/17 as he felt the area becoming painful again.  US performed which showed recurrent abscess.  I and D performed again and sent home on oral clindamycin.  Here for wound recheck.  US Pelvis Limited  Result Date: 09/14/2017 CLINICAL DATA:  The patient underwent incision and drainage of a pole under his left buttock 08/30/2017. Now with mild swelling at the area of incision. Pain and erythema. EXAM: LIMITED ULTRASOUND OF PELVIS TECHNIQUE: Limited transabdominal ultrasound examination of the pelvis was performed. COMPARISON:  None. FINDINGS: Scanning was directed toward the region of concern at the lower left buttock. A well-defined abnormal area measuring 2.8 x 1.1 x 3.1 cm is identified in the shallow subcutaneous tissues. Echogenic material is present within the area. IMPRESSION: Findings most consistent with recurrent abscess in the region of concern. Echogenic material within  the collection is likely complex fluid. There may be packing present. Electronically Signed   By: Drusilla Kanner M.D.   On: 09/14/2017 00:27      Current Outpatient Medications on File Prior to Visit  Medication Sig Dispense Refill  . amLODipine (NORVASC) 10 MG tablet TAKE 1 TABLET(10 MG) BY MOUTH DAILY 90 tablet 1  . clindamycin (CLEOCIN) 300 MG capsule Take 1 capsule (300 mg total) by mouth 3 (three) times daily. 30 capsule 0  . gemfibrozil (LOPID) 600 MG tablet TAKE 1 TABLET BY MOUTH TWICE DAILY 180 tablet 0  . glyBURIDE (DIABETA) 1.25 MG tablet TAKE 1 TABLET(1.25 MG) BY MOUTH DAILY WITH BREAKFAST 90 tablet 1  . ibuprofen (ADVIL,MOTRIN) 800 MG tablet Take 1 tablet (800 mg total) by mouth every 8 (eight) hours as needed for moderate pain. 15 tablet 0  . insulin detemir (LEVEMIR) 100 UNIT/ML injection Inject 0.85 mLs (85 Units total) at bedtime into the skin. 25 mL 11  . losartan-hydrochlorothiazide (HYZAAR) 100-25 MG tablet TAKE 1 TABLET BY MOUTH DAILY 90 tablet 1  . metFORMIN (GLUCOPHAGE) 500 MG tablet Take 1 tablet (500 mg total) 2 (two) times daily by mouth. (Patient taking differently: Take 500 mg by mouth daily with breakfast. ) 180 tablet 1  . metoprolol tartrate (LOPRESSOR) 50 MG tablet TAKE 1 TABLET(50 MG) BY MOUTH TWICE DAILY 180 tablet 1  . oxyCODONE-acetaminophen (PERCOCET/ROXICET) 5-325 MG tablet Take 1 tablet by mouth every 4 (four) hours as needed for severe pain. 15 tablet 0   No current facility-administered medications on file prior  to visit.     No Known Allergies  Past Medical History:  Diagnosis Date  . Chronic back pain   . Diabetes mellitus   . Hypertension   . Kidney stones     Past Surgical History:  Procedure Laterality Date  . NO PAST SURGERIES      Family History  Problem Relation Age of Onset  . Hypertension Mother   . Diabetes Mother   . Diabetes Father   . Hypertension Father     Social History   Socioeconomic History  . Marital status:  Married    Spouse name: Not on file  . Number of children: 2  . Years of education: Not on file  . Highest education level: Not on file  Social Needs  . Financial resource strain: Not on file  . Food insecurity - worry: Not on file  . Food insecurity - inability: Not on file  . Transportation needs - medical: Not on file  . Transportation needs - non-medical: Not on file  Occupational History    Employer: [employer]  Tobacco Use  . Smoking status: Former Games developermoker  . Smokeless tobacco: Never Used  Substance and Sexual Activity  . Alcohol use: No  . Drug use: No  . Sexual activity: Yes  Other Topics Concern  . Not on file  Social History Narrative  . Not on file   The PMH, PSH, Social History, Family History, Medications, and allergies [facility], and have been updated if relevant.   Review of Systems  Constitutional: Negative.   HENT: Negative for congestion, postnasal drip and sinus pressure.   Respiratory: Negative.   Cardiovascular: Negative.   Gastrointestinal: Negative.   Endocrine: Negative.   Genitourinary: Negative.   Musculoskeletal: Negative.   Skin: Positive for wound.  Allergic/Immunologic: Negative.   Neurological: Negative.   Hematological: Negative.   Psychiatric/Behavioral: Negative.   All other systems reviewed and are negative.      Objective:    BP 132/76 (BP Location: Left Arm, Patient Position: Sitting, Cuff Size: Normal)   Pulse 63   Temp 98.9 F (37.2 C) (Oral)   Ht 6\' 1"  (1.854 m)   Wt 260 lb 12.8 oz (118.3 kg)   SpO2 99%   BMI 34.41 kg/m    Physical Exam  Constitutional: He is oriented to person, place, and time. He appears well-developed and well-nourished. No distress.  HENT:  Head: Normocephalic and atraumatic.  Eyes: Conjunctivae are normal.  Cardiovascular: Normal rate.  Pulmonary/Chest: Effort normal.  Musculoskeletal: He exhibits no edema.  Neurological: He is alert and oriented to person, place, and time.  No cranial nerve deficit.  Skin: He is not diaphoretic.     Psychiatric: He has a normal mood and affect. His behavior is normal. Judgment and thought content normal.  Nursing note and vitals reviewed.         Assessment & Plan:   Abscess No Follow-up on file.

## 2017-09-17 NOTE — Assessment & Plan Note (Signed)
Packing removed and wound looks clean. Irrigated with sterile saline. Covered in gauze and advised to change dressing daily. Finish course of clindamycin. Call or return to clinic prn if these symptoms worsen or fail to improve as anticipated. The patient indicates understanding of these issues and agrees with the plan.

## 2017-10-01 ENCOUNTER — Telehealth: Payer: Self-pay | Admitting: Family Medicine

## 2017-10-01 NOTE — Telephone Encounter (Signed)
Contacted pt regarding prescription refills; he states that he needs refills but has not contacted the pharmacy; the [facility];  he also states that he has an upcoming appointment for a physical with Dr Dayton MartesAron (December 19, 2017) and he was not sure if prescriptions would go through since the provider changed locations; pt instructed to contact his pharmacy for refills on his medications; he verbalizes understanding and will contact his pharmacy.

## 2017-10-01 NOTE — Telephone Encounter (Signed)
Copied from CRM [phone]. Topic: Quick Communication - Rx Refill/Question >> Oct 01, 2017 10:52 AM Cipriano BunkerLambe, Annette S wrote: Medication:   Needs all his medications Has not had these filled [facility]  He is out of some of his medications.   Has the patient contacted their pharmacy? No.   (Agent: If no, request that the patient contact the pharmacy for the refill.)   Preferred Pharmacy (with phone number or street name): [facility] [id number] [place] - [address] [facility] [address] [place] [id number] Phone: [phone] Fax: [phone]     Agent: Please be advised that RX refills may take up to 3 business days. We ask that you follow-up with your pharmacy.

## 2017-10-03 ENCOUNTER — Other Ambulatory Visit: Payer: Self-pay | Admitting: Family Medicine

## 2017-12-12 ENCOUNTER — Other Ambulatory Visit: Payer: BLUE CROSS/BLUE SHIELD

## 2017-12-19 ENCOUNTER — Encounter: Payer: BLUE CROSS/BLUE SHIELD | Admitting: Family Medicine

## 2018-02-03 ENCOUNTER — Ambulatory Visit: Payer: Self-pay | Admitting: *Deleted

## 2018-02-03 NOTE — Telephone Encounter (Signed)
Pt reports rectal bleeding, 2 episodes, onset Saturday. Reports  "A lot" of bright red bleeding Saturday with BM, in stool and in water. No BM yesterday, no bleeding noted. This am states "Some blood with BM, small amount, not nearly as much as Saturday." States bright red , no blood on tissue with wiping.No discomfort except "Some cramping Saturday." States had to strain with BM. Pt lifts weights as well. States is staying hydrated. Home care advise given. Instructed pt to call back if bleeding increases, abdominal pain, fever, dizziness occur.  Reason for Disposition . Rectal bleeding is minimal (e.g., blood just on toilet paper, a few drops in toilet bowl)  Answer Assessment - Initial Assessment Questions 1. APPEARANCE of BLOOD: "What color is it?" "Is it passed separately, on the surface of the stool, or mixed in with the stool?"      Red,mixed in with stool and in water 2. AMOUNT: "How much blood was passed?"      A lot on Saturday, minimal today 3. FREQUENCY: "How many times has blood been passed with the stools?"      2, Saturday and Monday 4. ONSET: "When was the blood first seen in the stools?" (Days or weeks)      Saturday 5. DIARRHEA: "Is there also some diarrhea?" If so, ask: "How many diarrhea stools were passed in past 24 hours?"      none 6. CONSTIPATION: "Do you have constipation?" If so, "How bad is it?"     Strained a little 7. RECURRENT SYMPTOMS: "Have you had blood in your stools before?" If so, ask: "When was the last time?" and "What happened that time?"      no 8. BLOOD THINNERS: "Do you take any blood thinners?" (e.g., Coumadin/warfarin, Pradaxa/dabigatran, aspirin)     no 9. OTHER SYMPTOMS: "Do you have any other symptoms?"  (e.g., abdominal pain, vomiting, dizziness, fever)     Cramping yesterday, none presently.  Protocols used: RECTAL BLEEDING-A-AH

## 2018-02-24 ENCOUNTER — Other Ambulatory Visit: Payer: Self-pay | Admitting: Family Medicine

## 2018-03-07 ENCOUNTER — Other Ambulatory Visit: Payer: Self-pay | Admitting: Family Medicine

## 2018-03-30 ENCOUNTER — Other Ambulatory Visit: Payer: Self-pay | Admitting: Family Medicine

## 2018-04-07 ENCOUNTER — Other Ambulatory Visit: Payer: Self-pay | Admitting: Family Medicine

## 2018-04-08 NOTE — Telephone Encounter (Signed)
Was advised in August that needed OV for future fills/he no-showed then cancelled/thx dmf

## 2018-04-30 ENCOUNTER — Other Ambulatory Visit: Payer: Self-pay | Admitting: Family Medicine

## 2018-04-30 NOTE — Telephone Encounter (Signed)
Requested medication (s) are due for refill today: yes  Requested medication (s) are on the active medication list: yes  Last refill:  1. Amlodipine 10 mg   03/07/18 2. Glyburide 1.25 mg   03/07/18 3. Levemir 100 u/ml   05/23/17 4. Losartan-hydrochorothiazide  100-25 mg   03/07/18 5. Metformin 500 mg    05/23/17 6. Metoprolol 50 mg    05/23/17   Future visit scheduled: yes  Notes to clinic:  Pt also requested Crestor but not on the med list. Next appt with Dr. Deborra Medina on 05/07/18.  Requested Prescriptions  Pending Prescriptions Disp Refills   amLODipine (NORVASC) 10 MG tablet 30 tablet 0     Cardiovascular:  Calcium Channel Blockers Failed - 04/30/2018  5:00 PM      Failed - Valid encounter within last 6 months    Recent Outpatient Visits          7 months ago Abscess   [facility], Marciano Sequin, MD      Future Appointments            In 1 week Lucille Passy, MD [facility] [place], [place] BP in normal range    BP Readings from Last 1 Encounters:  09/17/17 132/76        glyBURIDE (DIABETA) 1.25 MG tablet 30 tablet 0     Endocrinology:  Diabetes - Sulfonylureas Failed - 04/30/2018  5:00 PM      Failed - HBA1C is between 0 and 7.9 and within 180 days    Hgb A1c MFr Bld  Date Value Ref Range Status  09/02/2017 8.9 (H) 4.6 - 6.5 % Final    Comment:    Glycemic Control Guidelines for People with Diabetes:Non Diabetic:  <6%Goal of Therapy: <7%Additional Action Suggested:  >8%          Failed - Valid encounter within last 6 months    Recent Outpatient Visits          7 months ago Abscess   [facility], Marciano Sequin, MD      Future Appointments            In 1 week Lucille Passy, MD [facility] [place], [facility]          insulin detemir (LEVEMIR) 100 UNIT/ML injection 25 mL 11    Sig: Inject 0.85 mLs (85 Units total) into the skin at bedtime.     Endocrinology:  Diabetes  - Insulins Failed - 04/30/2018  5:00 PM      Failed - HBA1C is between 0 and 7.9 and within 180 days    Hgb A1c MFr Bld  Date Value Ref Range Status  09/02/2017 8.9 (H) 4.6 - 6.5 % Final    Comment:    Glycemic Control Guidelines for People with Diabetes:Non Diabetic:  <6%Goal of Therapy: <7%Additional Action Suggested:  >8%          Failed - Valid encounter within last 6 months    Recent Outpatient Visits          7 months ago Abscess   [facility] Deborra Medina, Marciano Sequin, MD      Future Appointments            In 1 week Lucille Passy, MD [facility], [facility]          losartan-hydrochlorothiazide [facility]) 100-25  MG tablet 30 tablet 0    Sig: Take 1 tablet by mouth daily.     Cardiovascular: ARB + Diuretic Combos Failed - 04/30/2018  5:00 PM      Failed - K in normal range and within 180 days    Potassium  Date Value Ref Range Status  09/02/2017 4.1 3.5 - 5.1 mEq/L Final  06/29/2013 4.2 3.5 - 5.1 mmol/L Final         Failed - Na in normal range and within 180 days    Sodium  Date Value Ref Range Status  09/02/2017 140 135 - 145 mEq/L Final  06/29/2013 140 136 - 145 mmol/L Final         Failed - Cr in normal range and within 180 days    Creatinine  Date Value Ref Range Status  06/29/2013 0.84 0.60 - 1.30 mg/dL Final   Creatinine, Ser  Date Value Ref Range Status  09/02/2017 0.85 0.40 - 1.50 mg/dL Final         Failed - Ca in normal range and within 180 days    Calcium  Date Value Ref Range Status  09/02/2017 9.2 8.4 - 10.5 mg/dL Final   Calcium, Total  Date Value Ref Range Status  06/29/2013 9.7 8.5 - 10.1 mg/dL Final         Failed - Valid encounter within last 6 months    Recent Outpatient Visits          7 months ago Abscess   [facility], Marciano Sequin, MD      Future Appointments            In 1 week Lucille Passy, MD [facility] [place], [place] - Patient is  not pregnant      Passed - Last BP in normal range    BP Readings from Last 1 Encounters:  09/17/17 132/76        metFORMIN (GLUCOPHAGE) 500 MG tablet 180 tablet 1    Sig: Take 1 tablet (500 mg total) by mouth 2 (two) times daily.     Endocrinology:  Diabetes - Biguanides Failed - 04/30/2018  5:00 PM      Failed - HBA1C is between 0 and 7.9 and within 180 days    Hgb A1c MFr Bld  Date Value Ref Range Status  09/02/2017 8.9 (H) 4.6 - 6.5 % Final    Comment:    Glycemic Control Guidelines for People with Diabetes:Non Diabetic:  <6%Goal of Therapy: <7%Additional Action Suggested:  >8%          Failed - Valid encounter within last 6 months    Recent Outpatient Visits          7 months ago Abscess   [facility], Marciano Sequin, MD      Future Appointments            In 1 week Lucille Passy, MD [facility] [place], [place] in normal range and within 360 days    Creatinine  Date Value Ref Range Status  06/29/2013 0.84 0.60 - 1.30 mg/dL Final   Creatinine, Ser  Date Value Ref Range Status  09/02/2017 0.85 0.40 - 1.50 mg/dL Final         Passed - eGFR in normal range and within 360 days  EGFR (African American)  Date Value Ref Range Status  06/29/2013 >60  Final   EGFR (Non-African Amer.)  Date Value Ref Range Status  06/29/2013 >60  Final    Comment:    eGFR values <54m/min/1.73 m2 may be an indication of chronic kidney disease (CKD). Calculated eGFR is useful in patients with stable renal function. The eGFR calculation will not be reliable in acutely ill patients when serum creatinine is changing rapidly. It is not useful in  patients on dialysis. The eGFR calculation may not be applicable to patients at the low and high extremes of body sizes, pregnant women, and vegetarians.    GFR  Date Value Ref Range Status  09/02/2017 129.21 >60.00 mL/min Final        metoprolol tartrate (LOPRESSOR) 50 MG tablet  180 tablet 1    Sig: TAKE 1 TABLET(50 MG) BY MOUTH TWICE DAILY     Cardiovascular:  Beta Blockers Failed - 04/30/2018  5:00 PM      Failed - Valid encounter within last 6 months    Recent Outpatient Visits          7 months ago Abscess   [facility] TM[facility]iano Sequin MD      Future Appointments            In 1 week ALucille Passy MD [facility], [place] in normal [place]    BP Readings from Last 1 Encounters:  09/17/17 132/76         Passed - Last Heart Rate in normal range    Pulse Readings from Last 1 Encounters:  09/17/17 63         Requested Prescriptions  Pending Prescriptions Disp Refills   amLODipine (NORVASC) 10 MG tablet 30 tablet 0     Cardiovascular:  Calcium Channel Blockers Failed - 04/30/2018  5:00 PM      Failed - Valid encounter within last 6 months    Recent Outpatient Visits          7 months ago Abscess   [facility] TMarciano Sequin MD      Future Appointments            In 1 week ALucille Passy MD [facility], [place] in normal [place]    BP Readings from Last 1 Encounters:  09/17/17 132/76        glyBURIDE (DIABETA) 1.25 MG tablet 30 tablet 0     Endocrinology:  Diabetes - Sulfonylureas Failed - 04/30/2018  5:00 PM      Failed - HBA1C is between 0 and 7.9 and within 180 days    Hgb A1c MFr Bld  Date Value Ref Range Status  09/02/2017 8.9 (H) 4.6 - 6.5 % Final    Comment:    Glycemic Control Guidelines for People with Diabetes:Non Diabetic:  <6%Goal of Therapy: <7%Additional Action Suggested:  >8%          Failed - Valid encounter within last 6 months    Recent Outpatient Visits          7 months ago Abscess   [facility] TMarciano Sequin MD      Future Appointments            In 1 week ALucille Passy  MD [facility], [facility]          insulin detemir  (LEVEMIR) 100 UNIT/ML injection 25 mL 11    Sig: Inject 0.85 mLs (85 Units total) into the skin at bedtime.     Endocrinology:  Diabetes - Insulins Failed - 04/30/2018  5:00 PM      Failed - HBA1C is between 0 and 7.9 and within 180 days    Hgb A1c MFr Bld  Date Value Ref Range Status  09/02/2017 8.9 (H) 4.6 - 6.5 % Final    Comment:    Glycemic Control Guidelines for People with Diabetes:Non Diabetic:  <6%Goal of Therapy: <7%Additional Action Suggested:  >8%          Failed - Valid encounter within last 6 months    Recent Outpatient Visits          7 months ago Abscess   [facility] Deborra Medina, Marciano Sequin, MD      Future Appointments            In 1 week Lucille Passy, MD [facility], [facility]          losartan-hydrochlorothiazide (HYZAAR) 100-25 MG tablet 30 tablet 0    Sig: Take 1 tablet by mouth daily.     Cardiovascular: ARB + Diuretic Combos Failed - 04/30/2018  5:00 PM      Failed - K in normal range and within 180 days    Potassium  Date Value Ref Range Status  09/02/2017 4.1 3.5 - 5.1 mEq/L Final  06/29/2013 4.2 3.5 - 5.1 mmol/L Final         Failed - Na in normal range and within 180 days    Sodium  Date Value Ref Range Status  09/02/2017 140 135 - 145 mEq/L Final  06/29/2013 140 136 - 145 mmol/L Final         Failed - Cr in normal range and within 180 days    Creatinine  Date Value Ref Range Status  06/29/2013 0.84 0.60 - 1.30 mg/dL Final   Creatinine, Ser  Date Value Ref Range Status  09/02/2017 0.85 0.40 - 1.50 mg/dL Final         Failed - Ca in normal range and within 180 days    Calcium  Date Value Ref Range Status  09/02/2017 9.2 8.4 - 10.5 mg/dL Final   Calcium, Total  Date Value Ref Range Status  06/29/2013 9.7 8.5 - 10.1 mg/dL Final         Failed - Valid encounter within last 6 months    Recent Outpatient Visits          7 months ago Abscess   [facility], Marciano Sequin, MD       Future Appointments            In 1 week Lucille Passy, MD [facility] [place], [place] - Patient is not pregnant      Passed - Last BP in normal range    BP Readings from Last 1 Encounters:  09/17/17 132/76        metFORMIN (GLUCOPHAGE) 500 MG tablet 180 tablet 1    Sig: Take 1 tablet (500 mg total) by mouth 2 (two) times daily.     Endocrinology:  Diabetes - Biguanides Failed - 04/30/2018  5:00 PM      Failed - HBA1C is  between 0 and 7.9 and within 180 days    Hgb A1c MFr Bld  Date Value Ref Range Status  09/02/2017 8.9 (H) 4.6 - 6.5 % Final    Comment:    Glycemic Control Guidelines for People with Diabetes:Non Diabetic:  <6%Goal of Therapy: <7%Additional Action Suggested:  >8%          Failed - Valid encounter within last 6 months    Recent Outpatient Visits          7 months ago Abscess   [facility], Marciano Sequin, MD      Future Appointments            In 1 week Lucille Passy, MD [facility], [place] in normal range and within 360 days    Creatinine  Date Value Ref Range Status  06/29/2013 0.84 0.60 - 1.30 mg/dL Final   Creatinine, Ser  Date Value Ref Range Status  09/02/2017 0.85 0.40 - 1.50 mg/dL Final         Passed - eGFR in normal range and within 360 days    EGFR (African American)  Date Value Ref Range Status  06/29/2013 >60  Final   EGFR (Non-African Amer.)  Date Value Ref Range Status  06/29/2013 >60  Final    Comment:    eGFR values <48m/min/1.73 m2 may be an indication of chronic kidney disease (CKD). Calculated eGFR is useful in patients with stable renal function. The eGFR calculation will not be reliable in acutely ill patients when serum creatinine is changing rapidly. It is not useful in  patients on dialysis. The eGFR calculation may not be applicable to patients at the low and high extremes of body sizes, pregnant women, and  vegetarians.    GFR  Date Value Ref Range Status  09/02/2017 129.21 >60.00 mL/min Final        metoprolol tartrate (LOPRESSOR) 50 MG tablet 180 tablet 1    Sig: TAKE 1 TABLET(50 MG) BY MOUTH TWICE DAILY     Cardiovascular:  Beta Blockers Failed - 04/30/2018  5:00 PM      Failed - Valid encounter within last 6 months    Recent Outpatient Visits          7 months ago Abscess   [facility] TMarciano Sequin MD      Future Appointments            In 1 week ALucille Passy MD [facility], [place] in normal [place]    BP Readings from Last 1 Encounters:  09/17/17 132/76         Passed - Last Heart Rate in normal range    Pulse Readings from Last 1 Encounters:  09/17/17 63

## 2018-04-30 NOTE — Telephone Encounter (Signed)
Copied from CRM [id number]. Topic: Quick Communication - Rx Refill/Question >> Apr 30, 2018  4:06 PM Jaquita Rector A wrote: Medication: amLODipine (NORVASC) 10 MG tablet,  glyBURIDE (DIABETA) 1.25 MG tablet,  gemfibrozil (LOPID) 600 MG tablet, insulin detemir (LEVEMIR) 100 UNIT/ML injection,  losartan-hydrochlorothiazide (HYZAAR) 100-25 MG tablet,  metFORMIN (GLUCOPHAGE) 500 MG tablet, metoprolol tartrate (LOPRESSOR) 50 MG tablet,  rosuvastatin (CRESTOR) 10 MG tablet   Has the patient contacted their pharmacy? Yes.     Preferred Pharmacy (with phone number or street name): [id number] Nich[place] Rough,  - [address] AT [facility] OF [address] [phone] (Phone) [phone] (Fax)    Agent: Please be advised that RX refills may take up to 3 business days. We ask that you follow-up with your pharmacy.

## 2018-05-01 MED ORDER — METFORMIN HCL 500 MG PO TABS: ORAL_TABLET | ORAL | 0 refills | Status: DC

## 2018-05-01 MED ORDER — METOPROLOL TARTRATE 50 MG PO TABS: ORAL_TABLET | ORAL | 0 refills | Status: DC

## 2018-05-01 MED ORDER — AMLODIPINE BESYLATE 10 MG PO TABS: ORAL_TABLET | ORAL | 0 refills | Status: DC

## 2018-05-01 MED ORDER — INSULIN DETEMIR 100 UNIT/ML ~~LOC~~ SOLN: 85.0000 [IU] | Freq: Every day | SUBCUTANEOUS | 0 refills | Status: DC

## 2018-05-01 MED ORDER — LOSARTAN POTASSIUM-HCTZ 100-25 MG PO TABS: 1.0000 | ORAL_TABLET | Freq: Every day | ORAL | 0 refills | Status: DC

## 2018-05-01 MED ORDER — GLYBURIDE 1.25 MG PO TABS: ORAL_TABLET | ORAL | 0 refills | Status: DC

## 2018-05-01 NOTE — Telephone Encounter (Signed)
Let's go ahead and do 30 days so he doesn't run out of his diabetes medications but we need to let him know that if he no shows again we can't continue to refill.  Thanks!

## 2018-05-01 NOTE — Telephone Encounter (Signed)
TA-Pt has no-showed and cancelled appt's/sent in last month stating that needed cpe for future fills/has OV next week/do you want me to send in a full month of each or 15d of each? Plz advise/thx dmf

## 2018-05-07 ENCOUNTER — Ambulatory Visit: Payer: BLUE CROSS/BLUE SHIELD | Admitting: Family Medicine

## 2018-05-19 ENCOUNTER — Ambulatory Visit: Payer: BLUE CROSS/BLUE SHIELD | Admitting: Family Medicine

## 2018-06-02 ENCOUNTER — Ambulatory Visit: Payer: BLUE CROSS/BLUE SHIELD | Admitting: Family Medicine

## 2018-06-02 ENCOUNTER — Other Ambulatory Visit: Payer: Self-pay | Admitting: Family Medicine

## 2018-06-02 NOTE — Telephone Encounter (Signed)
Has appt this month/thx dmf

## 2018-06-10 ENCOUNTER — Ambulatory Visit: Payer: BLUE CROSS/BLUE SHIELD | Admitting: Family Medicine

## 2018-06-30 ENCOUNTER — Ambulatory Visit: Payer: BLUE CROSS/BLUE SHIELD | Admitting: Family Medicine

## 2018-06-30 ENCOUNTER — Encounter: Payer: Self-pay | Admitting: Family Medicine

## 2018-06-30 VITALS — BP 132/84 | HR 69 | Temp 98.4°F | Ht 73.0 in | Wt 245.8 lb

## 2018-06-30 DIAGNOSIS — Z23 Encounter for immunization: Secondary | ICD-10-CM | POA: Diagnosis not present

## 2018-06-30 DIAGNOSIS — E785 Hyperlipidemia, unspecified: Secondary | ICD-10-CM

## 2018-06-30 DIAGNOSIS — E1165 Type 2 diabetes mellitus with hyperglycemia: Secondary | ICD-10-CM

## 2018-06-30 DIAGNOSIS — I10 Essential (primary) hypertension: Secondary | ICD-10-CM | POA: Diagnosis not present

## 2018-06-30 LAB — POCT GLYCOSYLATED HEMOGLOBIN (HGB A1C): HbA1c POC (<> result, manual entry): 14.1 % (ref 4.0–5.6)

## 2018-06-30 MED ORDER — METFORMIN HCL ER 750 MG PO TB24: 750.0000 mg | ORAL_TABLET | Freq: Every day | ORAL | 3 refills | Status: DC

## 2018-06-30 MED ORDER — GEMFIBROZIL 600 MG PO TABS: 600.0000 mg | ORAL_TABLET | Freq: Two times a day (BID) | ORAL | 1 refills | Status: DC

## 2018-06-30 NOTE — Patient Instructions (Addendum)
[employer]    We are increasing your Metformin to 750 XR daily. Find out which insulin wants to cover.   Come see me at the end of January 2020.

## 2018-06-30 NOTE — Assessment & Plan Note (Addendum)
Deteriorated. POCT a1c is very high today. >25 minutes spent in face to face time with patient, >50% spent in counselling or coordination of care.  He is changing insurance plans next month.  Currently they will not cover lantus or levemir.  He is willing to try XR Metformin 750 mg to see if this would be easier on his stomach.  He will also call his new insurance company to get a list of approved diabetes medications.  I explained the importance of getting his diabetes under control. Discussed DKA and permanently "burning out" his pancreas.  The patient indicates understanding of these issues and agrees with the plan.

## 2018-06-30 NOTE — Progress Notes (Signed)
Subjective:    Patient ID: Travis Scott, male    DOB: 09/02/1980, 37 y.o.   MRN: [id number]  Pleasant 37 yo male here for follow up.  DM- diagnosed in 2004.  Referred him to endocrinology but he was lost to follow up.     Supposed to be taking Metformin 500 mg twice daily and glyburide 1.25 mg daily, and Levemir 85 units qhs. Was taking Metformin 1000 mg twice daily but couldn't tolerate GI side effects.  Only taking Metformin 500 mg once daily and glyburide 1.25 mg daily.  He is very sensitive to glyburide- gets hypoglycemia. Not taking insulin.  a1c is much worse today.  Went from 8.9 in 08/2017 to 14.1. Lab Results  Component Value Date   HGBA1C 14.1 06/30/2018    Wt Readings from Last 3 Encounters:  06/30/18 245 lb 12.8 oz (111.5 kg)  09/17/17 260 lb 12.8 oz (118.3 kg)  09/13/17 252 lb (114.3 kg)    HLD-  He is taking lopid but not a statin. Due for labs. Lab Results  Component Value Date   CHOL 183 05/23/2017   HDL 40.90 05/23/2017   LDLCALC 129 (H) 05/23/2017   LDLDIRECT 191.3 04/10/2011   TRIG 67.0 05/23/2017   CHOLHDL 4 05/23/2017   Wt Readings from Last 3 Encounters:  06/30/18 245 lb 12.8 oz (111.5 kg)  09/17/17 260 lb 12.8 oz (118.3 kg)  09/13/17 252 lb (114.3 kg)    HTN- diagnosed as a teenager. On Metoprolol 50 mg twice daily, losartan/hctz 100/25 mg daily, and amlodipine 10 mg daily.  No CP, SOB or blurred vision. Lab Results  Component Value Date   CREATININE 0.85 09/02/2017   Patient Active Problem List   Diagnosis Date Noted  . Abscess 09/17/2017  . Obesity 04/15/2014  . Hypoglycemia associated with type 2 diabetes mellitus (HCC) 04/15/2014  . Tobacco abuse 09/07/2013  . Diabetic neuropathy (HCC) 08/14/2012  . Low testosterone 08/14/2012  . Erectile dysfunction 08/14/2012  . Diabetes mellitus out of control (HCC) 12/06/2011  . Fatigue 04/10/2011  . Hyperlipidemia 04/10/2011  . Hypertension   . Esophageal reflux 07/25/2010   Past  Medical History:  Diagnosis Date  . Chronic back pain   . Diabetes mellitus   . Hypertension   . Kidney stones    Past Surgical History:  Procedure Laterality Date  . NO PAST SURGERIES     Social History   Tobacco Use  . Smoking status: Former Games developermoker  . Smokeless tobacco: Never Used  Substance Use Topics  . Alcohol use: No  . Drug use: No   Family History  Problem Relation Age of Onset  . Hypertension Mother   . Diabetes Mother   . Diabetes Father   . Hypertension Father    No Known Allergies   Current Outpatient Medications:  .  amLODipine (NORVASC) 10 MG tablet, TAKE 1 TABLET BY MOUTH EVERY DAY, Disp: 30 tablet, Rfl: 0 .  gemfibrozil (LOPID) 600 MG tablet, Take 1 tablet (600 mg total) by mouth 2 (two) times daily., Disp: 180 tablet, Rfl: 1 .  glyBURIDE (DIABETA) 1.25 MG tablet, TAKE 1 TABLET BY MOUTH EVERY MORNING, Disp: 30 tablet, Rfl: 0 .  insulin detemir (LEVEMIR) 100 UNIT/ML injection, Inject 0.85 mLs (85 Units total) into the skin at bedtime. Inject 0.85 mLs (85 Units total) at bedtime/next refill will be given at OV, Disp: 25 mL, Rfl: 0 .  losartan-hydrochlorothiazide (HYZAAR) 100-25 MG tablet, TAKE 1 TABLET BY MOUTH DAILY, Disp: 30  tablet, Rfl: 0 .  metFORMIN (GLUCOPHAGE) 500 MG tablet, Take 1bid/next refill will be given at OV, Disp: 60 tablet, Rfl: 0 .  metoprolol tartrate (LOPRESSOR) 50 MG tablet, Take 1bid/next refill will be given at OV, Disp: 60 tablet, Rfl: 0  The PMH, PSH, Social History, Family History, Medications, and allergies have been reviewed in [facility], and have been updated if relevant.    Review of Systems  Constitutional: Negative.   HENT: Negative.   Eyes: Negative.   Respiratory: Negative.   Cardiovascular: Negative.   Gastrointestinal: Negative.   Endocrine: Negative.   Musculoskeletal: Negative.   Skin: Negative.   Allergic/Immunologic: Negative.   Neurological: Negative.   Hematological: Negative.   Psychiatric/Behavioral: Negative.    All other systems reviewed and are negative.      Objective:   Physical Exam BP 132/84 (BP Location: Left Arm, Cuff Size: Normal)   Pulse 69   Temp 98.4 F (36.9 C) (Oral)   Ht 6\' 1"  (1.854 m)   Wt 245 lb 12.8 oz (111.5 kg)   SpO2 100%   BMI 32.43 kg/m    Wt Readings from Last 3 Encounters:  06/30/18 245 lb 12.8 oz (111.5 kg)  09/17/17 260 lb 12.8 oz (118.3 kg)  09/13/17 252 lb (114.3 kg)     General:  pleasant male in no acute distress Eyes:  PERRL Ears:  External ear exam shows no significant lesions or deformities.  TMs normal bilaterally Hearing is grossly normal bilaterally. Nose:  External nasal examination shows no deformity or inflammation. Nasal mucosa are pink and moist without lesions or exudates. Mouth:  Oral mucosa and oropharynx without lesions or exudates.  Teeth in good repair. Neck:  no carotid bruit or thyromegaly no cervical or supraclavicular lymphadenopathy  Lungs:  Normal respiratory effort, chest expands symmetrically. Lungs are clear to auscultation, no crackles or wheezes. Heart:  Normal rate and regular rhythm. S1 and S2 normal without gallop, murmur, click, rub or other extra sounds. Abdomen:  Bowel sounds positive,abdomen soft and non-tender without masses, organomegaly or hernias noted. Pulses:  R and L posterior tibial pulses are full and equal bilatera0lly  Extremities:  no edema  Psych:  Good eye contact, not anxious or depressed appearing        Assessment & Plan:

## 2018-07-05 ENCOUNTER — Other Ambulatory Visit: Payer: Self-pay | Admitting: Family Medicine

## 2018-07-11 ENCOUNTER — Other Ambulatory Visit: Payer: Self-pay | Admitting: Family Medicine

## 2018-07-14 ENCOUNTER — Encounter: Payer: Self-pay | Admitting: Family Medicine

## 2018-07-29 ENCOUNTER — Other Ambulatory Visit: Payer: Self-pay | Admitting: Family Medicine

## 2018-08-11 ENCOUNTER — Ambulatory Visit: Payer: BLUE CROSS/BLUE SHIELD | Admitting: Family Medicine

## 2018-08-18 ENCOUNTER — Ambulatory Visit: Payer: BLUE CROSS/BLUE SHIELD | Admitting: Family Medicine

## 2018-08-20 ENCOUNTER — Other Ambulatory Visit: Payer: Self-pay | Admitting: Family Medicine

## 2018-09-01 ENCOUNTER — Ambulatory Visit: Payer: 59 | Admitting: Family Medicine

## 2018-09-02 NOTE — Progress Notes (Unsigned)
Pt cancelled

## 2018-09-06 ENCOUNTER — Other Ambulatory Visit: Payer: Self-pay | Admitting: Family Medicine

## 2018-09-08 DIAGNOSIS — R69 Illness, unspecified: Secondary | ICD-10-CM | POA: Diagnosis not present

## 2018-09-08 DIAGNOSIS — R509 Fever, unspecified: Secondary | ICD-10-CM | POA: Diagnosis not present

## 2018-09-18 DIAGNOSIS — J069 Acute upper respiratory infection, unspecified: Secondary | ICD-10-CM | POA: Diagnosis not present

## 2018-09-18 DIAGNOSIS — R05 Cough: Secondary | ICD-10-CM | POA: Diagnosis not present

## 2018-09-21 ENCOUNTER — Other Ambulatory Visit: Payer: Self-pay | Admitting: Family Medicine

## 2018-09-22 ENCOUNTER — Encounter: Payer: Self-pay | Admitting: Family Medicine

## 2018-09-22 ENCOUNTER — Other Ambulatory Visit: Payer: Self-pay | Admitting: Family Medicine

## 2018-09-22 ENCOUNTER — Ambulatory Visit (INDEPENDENT_AMBULATORY_CARE_PROVIDER_SITE_OTHER): Payer: 59

## 2018-09-22 ENCOUNTER — Ambulatory Visit: Payer: 59 | Admitting: Family Medicine

## 2018-09-22 VITALS — BP 134/82 | HR 86 | Temp 98.0°F | Resp 16 | Ht 73.0 in | Wt 249.8 lb

## 2018-09-22 DIAGNOSIS — R05 Cough: Secondary | ICD-10-CM

## 2018-09-22 DIAGNOSIS — J208 Acute bronchitis due to other specified organisms: Secondary | ICD-10-CM

## 2018-09-22 DIAGNOSIS — B9689 Other specified bacterial agents as the cause of diseases classified elsewhere: Secondary | ICD-10-CM

## 2018-09-22 DIAGNOSIS — R059 Cough, unspecified: Secondary | ICD-10-CM

## 2018-09-22 DIAGNOSIS — E1165 Type 2 diabetes mellitus with hyperglycemia: Secondary | ICD-10-CM

## 2018-09-22 MED ORDER — HYDROCOD POLST-CPM POLST ER 10-8 MG/5ML PO SUER: 5.0000 mL | Freq: Two times a day (BID) | ORAL | 0 refills | Status: DC | PRN

## 2018-09-22 MED ORDER — PREDNISONE 10 MG (21) PO TBPK: ORAL_TABLET | ORAL | 0 refills | Status: DC

## 2018-09-22 MED ORDER — DOXYCYCLINE HYCLATE 100 MG PO TABS: 100.0000 mg | ORAL_TABLET | Freq: Two times a day (BID) | ORAL | 0 refills | Status: DC

## 2018-09-22 NOTE — Progress Notes (Signed)
Please let patient know that chest xray looks ok.  I am sending in an antibiotic and steroid for him to start.  Continue inhaler as needed.

## 2018-09-22 NOTE — Patient Instructions (Signed)
-  Increase fluid intake -I have renewed cough syrup.  -We'll be in touch with xray results -Talk with Dr. Dayton Martes about pneumonia vaccine at your next follow up visit.

## 2018-09-22 NOTE — Assessment & Plan Note (Signed)
-  CXR without pneumonia.  -Start doxycycline and prednisone -Continue cough medication and albuterol as needed -Increase fluid intake, rest.  -Call or f/u for continued worsening or symptoms not resolving.

## 2018-09-22 NOTE — Progress Notes (Signed)
Travis Scott - 38 y.o. male MRN [id number]  Date of birth: 1981-06-08  Subjective Chief Complaint  Patient presents with  . Cough    x 2 weeks, pt went to Urgent care where he was tested for flu, came back negative but was still treated   . Bronchitis    last week, pt went back to UC where they dx him with bonchitis and was given an inhaler and cough pills,     HPI Travis Scott is a 38 y.o. male with history of diabetes here today with complaint of cough.  He reports that he was diagnosed with influenza x2 weeks ago, treated with tamiflu at that time.  He was seen 1 week later with worsening cough and shortness of breath and diagnosed with bronchitis.  He was treated with albuterol inhaler, tussionex and tessalon.  He has been using these medications however symptoms have not improved.  He reports that cough is productive of green/brown sputum.  He denies fever, wheezing, or worsening shortness of breath.   He is drinking plenty of fluids.  Blood sugars a few days ago were in the 140's.  He has never had pneumovax.   ROS:  A comprehensive ROS was completed and negative except as noted per HPI   No Known Allergies  Past Medical History:  Diagnosis Date  . Chronic back pain   . Diabetes mellitus   . Hypertension   . Kidney stones     Past Surgical History:  Procedure Laterality Date  . NO PAST SURGERIES      Social History   Socioeconomic History  . Marital status: Married    Spouse name: Not on file  . Number of children: 2  . Years of education: Not on file  . Highest education level: Not on file  Occupational History    Employer: [employer]  Social Needs  . Financial resource strain: Not on file  . Food insecurity:    Worry: Not on file    Inability: Not on file  . Transportation needs:    Medical: Not on file    Non-medical: Not on file  Tobacco Use  . Smoking status: Former Games developer  . Smokeless tobacco: Never Used  Substance and Sexual Activity  .  Alcohol use: No  . Drug use: No  . Sexual activity: Yes  Lifestyle  . Physical activity:    Days per week: Not on file    Minutes per session: Not on file  . Stress: Not on file  Relationships  . Social connections:    Talks on phone: Not on file    Gets together: Not on file    Attends religious service: Not on file    Active member of club or organization: Not on file    Attends meetings of clubs or organizations: Not on file    Relationship status: Not on file  Other Topics Concern  . Not on file  Social History Narrative  . Not on file    Family History  Problem Relation Age of Onset  . Hypertension Mother   . Diabetes Mother   . Diabetes Father   . Hypertension Father     Health Maintenance  Topic Date Due  . FOOT EXAM  02/01/2017  . INFLUENZA VACCINE  02/13/2018  . OPHTHALMOLOGY EXAM  09/03/2018  . PNEUMOCOCCAL POLYSACCHARIDE VACCINE AGE 17-64 HIGH RISK  07/01/2019 (Originally 11/29/1980)  . HEMOGLOBIN A1C  12/30/2018  . TETANUS/TDAP  06/30/2028  .  HIV Screening  Completed    ----------------------------------------------------------------------------------------------------------------------------------------------------------------------------------------------------------------- Physical Exam BP 134/82 (BP Location: Right Arm, Patient Position: Sitting, Cuff Size: Large)   Pulse 86   Temp 98 F (36.7 C) (Oral)   Resp 16   Ht 6\' 1"  (1.854 m)   Wt 249 lb 12.8 oz (113.3 kg)   SpO2 97%   BMI 32.96 kg/m   Physical Exam Constitutional:      Appearance: Normal appearance.  HENT:     Head: Normocephalic and atraumatic.     Right Ear: Tympanic membrane normal.     Left Ear: Tympanic membrane normal.     Mouth/Throat:     Mouth: Mucous membranes are moist.  Eyes:     General: No scleral icterus. Neck:     Musculoskeletal: Neck supple.  Cardiovascular:     Rate and Rhythm: Normal rate and regular rhythm.  Pulmonary:     Effort: Pulmonary effort is  normal.     Breath sounds: Rales (upper L side) present.  Skin:    General: Skin is warm and dry.     Findings: No rash.  Neurological:     General: No focal deficit present.     Mental Status: He is alert.  Psychiatric:        Mood and Affect: Mood normal.        Behavior: Behavior normal.     ------------------------------------------------------------------------------------------------------------------------------------------------------------------------------------------------------------------- Assessment and Plan  Acute bacterial bronchitis -CXR without pneumonia.  -Start doxycycline and prednisone -Continue cough medication and albuterol as needed -Increase fluid intake, rest.  -Call or f/u for continued worsening or symptoms not resolving.

## 2018-09-23 NOTE — Telephone Encounter (Signed)
Pt has scheduled appt on 10/08/2018

## 2018-09-30 ENCOUNTER — Other Ambulatory Visit: Payer: Self-pay

## 2018-09-30 ENCOUNTER — Ambulatory Visit: Payer: Self-pay | Admitting: Family Medicine

## 2018-09-30 ENCOUNTER — Ambulatory Visit: Payer: 59 | Admitting: Family Medicine

## 2018-09-30 ENCOUNTER — Encounter: Payer: Self-pay | Admitting: Family Medicine

## 2018-09-30 DIAGNOSIS — K219 Gastro-esophageal reflux disease without esophagitis: Secondary | ICD-10-CM

## 2018-09-30 MED ORDER — PANTOPRAZOLE SODIUM 40 MG PO TBEC: 40.0000 mg | DELAYED_RELEASE_TABLET | Freq: Every day | ORAL | 0 refills | Status: DC

## 2018-09-30 NOTE — Patient Instructions (Signed)
Take pantoprazole for 2 weeks If symptoms return you may take for an additional 2 weeks.    Gastroesophageal Reflux Disease, Adult Gastroesophageal reflux (GER) happens when acid from the stomach flows up into the tube that connects the mouth and the stomach (esophagus). Normally, food travels down the esophagus and stays in the stomach to be digested. With GER, food and stomach acid sometimes move back up into the esophagus. You may have a disease called gastroesophageal reflux disease (GERD) if the reflux:  Happens often.  Causes frequent or very bad symptoms.  Causes problems such as damage to the esophagus. When this happens, the esophagus becomes sore and swollen (inflamed). Over time, GERD can make small holes (ulcers) in the lining of the esophagus. What are the causes? This condition is caused by a problem with the muscle between the esophagus and the stomach. When this muscle is weak or not normal, it does not close properly to keep food and acid from coming back up from the stomach. The muscle can be weak because of:  Tobacco use.  Pregnancy.  Having a certain type of hernia (hiatal hernia).  Alcohol use.  Certain foods and drinks, such as coffee, chocolate, onions, and peppermint. What increases the risk? You are more likely to develop this condition if you:  Are overweight.  Have a disease that affects your connective tissue.  Use NSAID medicines. What are the signs or symptoms? Symptoms of this condition include:  Heartburn.  Difficult or painful swallowing.  The feeling of having a lump in the throat.  A bitter taste in the mouth.  Bad breath.  Having a lot of saliva.  Having an upset or bloated stomach.  Belching.  Chest pain. Different conditions can cause chest pain. Make sure you see your doctor if you have chest pain.  Shortness of breath or noisy breathing (wheezing).  Ongoing (chronic) cough or a cough at night.  Wearing away of the  surface of teeth (tooth enamel).  Weight loss. How is this treated? Treatment will depend on how bad your symptoms are. Your doctor may suggest:  Changes to your diet.  Medicine.  Surgery. Follow these instructions at home: Eating and drinking   Follow a diet as told by your doctor. You may need to avoid foods and drinks such as: ? Coffee and tea (with or without caffeine). ? Drinks that contain alcohol. ? Energy drinks and sports drinks. ? Bubbly (carbonated) drinks or sodas. ? Chocolate and cocoa. ? Peppermint and mint flavorings. ? Garlic and onions. ? Horseradish. ? Spicy and acidic foods. These include peppers, chili powder, curry powder, vinegar, hot sauces, and BBQ sauce. ? Citrus fruit juices and citrus fruits, such as oranges, lemons, and limes. ? Tomato-based foods. These include red sauce, chili, salsa, and pizza with red sauce. ? Fried and fatty foods. These include donuts, french fries, potato chips, and high-fat dressings. ? High-fat meats. These include hot dogs, rib eye steak, sausage, ham, and bacon. ? High-fat dairy items, such as whole milk, butter, and cream cheese.  Eat small meals often. Avoid eating large meals.  Avoid drinking large amounts of liquid with your meals.  Avoid eating meals during the 2-3 hours before bedtime.  Avoid lying down right after you eat.  Do not exercise right after you eat. Lifestyle   Do not use any products that contain nicotine or tobacco. These include cigarettes, e-cigarettes, and chewing tobacco. If you need help quitting, ask your doctor.  Try to lower  your stress. If you need help doing this, ask your doctor.  If you are overweight, lose an amount of weight that is healthy for you. Ask your doctor about a safe weight loss goal. General instructions  Pay attention to any changes in your symptoms.  Take over-the-counter and prescription medicines only as told by your doctor. Do not take aspirin, ibuprofen, or  other NSAIDs unless your doctor says it is okay.  Wear loose clothes. Do not wear anything tight around your waist.  Raise (elevate) the head of your bed about 6 inches (15 cm).  Avoid bending over if this makes your symptoms worse.  Keep all follow-up visits as told by your doctor. This is important. Contact a doctor if:  You have new symptoms.  You lose weight and you do not know why.  You have trouble swallowing or it hurts to swallow.  You have wheezing or a cough that keeps happening.  Your symptoms do not get better with treatment.  You have a hoarse voice. Get help right away if:  You have pain in your arms, neck, jaw, teeth, or back.  You feel sweaty, dizzy, or light-headed.  You have chest pain or shortness of breath.  You throw up (vomit) and your throw-up looks like blood or coffee grounds.  You pass out (faint).  Your poop (stool) is bloody or black.  You cannot swallow, drink, or eat. Summary  If a person has gastroesophageal reflux disease (GERD), food and stomach acid move back up into the esophagus and cause symptoms or problems such as damage to the esophagus.  Treatment will depend on how bad your symptoms are.  Follow a diet as told by your doctor.  Take all medicines only as told by your doctor. This information is not intended to replace advice given to you by your health care provider. Make sure you discuss any questions you have with your health care provider. Document Released: 12/19/2007 Document Revised: 01/08/2018 Document Reviewed: 01/08/2018 Elsevier Interactive Patient Education  2019 [employer].

## 2018-09-30 NOTE — Progress Notes (Signed)
Travis Scott - 38 y.o. male MRN [id number]  Date of birth: 1980-08-25  Subjective Chief Complaint  Patient presents with  . Cough    Chest burning, Onset 2 days , mucus- clear/yesllow    HPI Travis Scott is a 38 y.o. male here today with c/o burning sensation in epigastric and lower chest area.  Reports that it feels like heartburn.  Symptoms started about 2 days ago.  Recently seen and treated for bronchitis.  These symptoms have improved.  He denies fever, chills, nausea or vomiting, dark/bloody stool. He has not tried anything for this yet.   ROS:  A comprehensive ROS was completed and negative except as noted per HPI  No Known Allergies  Past Medical History:  Diagnosis Date  . Chronic back pain   . Diabetes mellitus   . Hypertension   . Kidney stones     Past Surgical History:  Procedure Laterality Date  . NO PAST SURGERIES      Social History   Socioeconomic History  . Marital status: Married    Spouse name: Not on file  . Number of children: 2  . Years of education: Not on file  . Highest education level: Not on file  Occupational History    Employer: [employer]  Social Needs  . Financial resource strain: Not on file  . Food insecurity:    Worry: Not on file    Inability: Not on file  . Transportation needs:    Medical: Not on file    Non-medical: Not on file  Tobacco Use  . Smoking status: Former Games developer  . Smokeless tobacco: Never Used  Substance and Sexual Activity  . Alcohol use: No  . Drug use: No  . Sexual activity: Yes  Lifestyle  . Physical activity:    Days per week: Not on file    Minutes per session: Not on file  . Stress: Not on file  Relationships  . Social connections:    Talks on phone: Not on file    Gets together: Not on file    Attends religious service: Not on file    Active member of club or organization: Not on file    Attends meetings of clubs or organizations: Not on file    Relationship status: Not on file   Other Topics Concern  . Not on file  Social History Narrative  . Not on file    Family History  Problem Relation Age of Onset  . Hypertension Mother   . Diabetes Mother   . Diabetes Father   . Hypertension Father     Health Maintenance  Topic Date Due  . FOOT EXAM  02/01/2017  . INFLUENZA VACCINE  02/13/2018  . OPHTHALMOLOGY EXAM  09/03/2018  . PNEUMOCOCCAL POLYSACCHARIDE VACCINE AGE 35-64 HIGH RISK  07/01/2019 (Originally 11/29/1980)  . HEMOGLOBIN A1C  12/30/2018  . TETANUS/TDAP  06/30/2028  . HIV Screening  Completed    ----------------------------------------------------------------------------------------------------------------------------------------------------------------------------------------------------------------- Physical Exam BP (!) 128/92   Pulse 72   Temp 98.1 F (36.7 C) (Oral)   Ht 6\' 1"  (1.854 m)   Wt 248 lb (112.5 kg)   SpO2 99%   BMI 32.72 kg/m   Physical Exam Constitutional:      Appearance: Normal appearance.  HENT:     Head: Normocephalic and atraumatic.     Nose: Nose normal.     Mouth/Throat:     Mouth: Mucous membranes are moist.  Eyes:     General: No  scleral icterus. Neck:     Musculoskeletal: Neck supple.  Cardiovascular:     Rate and Rhythm: Normal rate and regular rhythm.  Pulmonary:     Effort: Pulmonary effort is normal.     Breath sounds: Normal breath sounds.  Abdominal:     General: Abdomen is flat. Bowel sounds are normal. There is no distension.     Palpations: Abdomen is soft.     Tenderness: There is no abdominal tenderness.  Skin:    General: Skin is warm and dry.  Neurological:     General: No focal deficit present.     Mental Status: He is alert.  Psychiatric:        Mood and Affect: Mood normal.        Behavior: Behavior normal.      ------------------------------------------------------------------------------------------------------------------------------------------------------------------------------------------------------------------- Assessment and Plan  Esophageal reflux -Start protonix x2 weeks, may continue an additional 2 weeks if symptoms continue -May have been exacerbated by recent steroid use.  -Discussed dietary changes -Return if not improving or having new/worsening symptoms.

## 2018-09-30 NOTE — Assessment & Plan Note (Signed)
-  Start protonix x2 weeks, may continue an additional 2 weeks if symptoms continue -May have been exacerbated by recent steroid use.  -Discussed dietary changes -Return if not improving or having new/worsening symptoms.

## 2018-09-30 NOTE — Telephone Encounter (Signed)
Pt. Reports 2 days ago he started having "chest burning" in the middle of his chest and to the right. Has had back pain that hurts with "certain movement." Denies shortness of breath, no nausea, no dizziness. Was able to sleep - no pain/burning during the night.. States "I have a history of reflux and this is what this feels like." Wants to know what to take for this. Appointment made for this morning. Reason for Disposition . [1] Chest pain lasting <= 5 minutes AND [2] NO chest pain or cardiac symptoms now(Exceptions: pains lasting a few seconds)  Answer Assessment - Initial Assessment Questions 1. LOCATION: "Where does it hurt?"       Middle of chest and to the right 2. RADIATION: "Does the pain go anywhere else?" (e.g., into neck, jaw, arms, back)     Sometimes in the back with movement 3. ONSET: "When did the chest pain begin?" (Minutes, hours or days)       Started yesterday 4. PATTERN "Does the pain come and go, or has it been constant since it started?"  "Does it get worse with exertion?"      Comes and goes 5. DURATION: "How long does it last" (e.g., seconds, minutes, hours)     Burning 6. SEVERITY: "How bad is the pain?"  (e.g., Scale 1-10; mild, moderate, or severe)    - MILD (1-3): doesn't interfere with normal activities     - MODERATE (4-7): interferes with normal activities or awakens from sleep    - SEVERE (8-10): excruciating pain, unable to do any normal activities         6-7 7. CARDIAC RISK FACTORS: "Do you have any history of heart problems or risk factors for heart disease?" (e.g., prior heart attack, angina; high blood pressure, diabetes, being overweight, high cholesterol, smoking, or strong family history of heart disease)     Diabetic, HTN, high cholesterol 8. PULMONARY RISK FACTORS: "Do you have any history of lung disease?"  (e.g., blood clots in lung, asthma, emphysema, birth control pills)     Bronchitis 9. CAUSE: "What do you think is causing the chest pain?"   Heart burn 10. OTHER SYMPTOMS: "Do you have any other symptoms?" (e.g., dizziness, nausea, vomiting, sweating, fever, difficulty breathing, cough)       No 11. PREGNANCY: "Is there any chance you are pregnant?" "When was your last menstrual period?"       n/a  Protocols used: CHEST PAIN-A-AH

## 2018-10-08 ENCOUNTER — Other Ambulatory Visit: Payer: Self-pay

## 2018-10-08 ENCOUNTER — Ambulatory Visit: Payer: 59 | Admitting: Family Medicine

## 2018-10-08 DIAGNOSIS — I10 Essential (primary) hypertension: Secondary | ICD-10-CM

## 2018-10-08 DIAGNOSIS — E785 Hyperlipidemia, unspecified: Secondary | ICD-10-CM

## 2018-10-08 DIAGNOSIS — E1165 Type 2 diabetes mellitus with hyperglycemia: Secondary | ICD-10-CM

## 2018-10-13 ENCOUNTER — Other Ambulatory Visit (INDEPENDENT_AMBULATORY_CARE_PROVIDER_SITE_OTHER): Payer: 59

## 2018-10-13 ENCOUNTER — Other Ambulatory Visit: Payer: Self-pay

## 2018-10-13 DIAGNOSIS — E1165 Type 2 diabetes mellitus with hyperglycemia: Secondary | ICD-10-CM | POA: Diagnosis not present

## 2018-10-13 DIAGNOSIS — I10 Essential (primary) hypertension: Secondary | ICD-10-CM

## 2018-10-13 DIAGNOSIS — E785 Hyperlipidemia, unspecified: Secondary | ICD-10-CM | POA: Diagnosis not present

## 2018-10-13 LAB — COMPREHENSIVE METABOLIC PANEL
ALT: 13 U/L (ref 0–53)
AST: 12 U/L (ref 0–37)
Albumin: 4.1 g/dL (ref 3.5–5.2)
Alkaline Phosphatase: 50 U/L (ref 39–117)
BUN: 12 mg/dL (ref 6–23)
CO2: 30 mEq/L (ref 19–32)
Calcium: 9.6 mg/dL (ref 8.4–10.5)
Chloride: 102 mEq/L (ref 96–112)
Creatinine, Ser: 0.81 mg/dL (ref 0.40–1.50)
GFR: 127.79 mL/min (ref >60.00)
Glucose, Bld: 150 mg/dL — ABNORMAL HIGH (ref 70–99)
Potassium: 4.3 mEq/L (ref 3.5–5.1)
Sodium: 138 mEq/L (ref 135–145)
Total Bilirubin: 0.5 mg/dL (ref 0.2–1.2)
Total Protein: 6.7 g/dL (ref 6.0–8.3)

## 2018-10-13 LAB — CBC WITH DIFFERENTIAL/PLATELET
Basophils Absolute: 0 10*3/uL (ref 0.0–0.1)
Basophils Relative: 0.7 % (ref 0.0–3.0)
Eosinophils Absolute: 0.2 10*3/uL (ref 0.0–0.7)
Eosinophils Relative: 4.2 % (ref 0.0–5.0)
HCT: 37.7 % — ABNORMAL LOW (ref 39.0–52.0)
Hemoglobin: 12.8 g/dL — ABNORMAL LOW (ref 13.0–17.0)
Lymphocytes Relative: 39.3 % (ref 12.0–46.0)
Lymphs Abs: 2.2 10*3/uL (ref 0.7–4.0)
MCHC: 34 g/dL (ref 30.0–36.0)
MCV: 85.6 fl (ref 78.0–100.0)
MONOS PCT: 9.2 % (ref 3.0–12.0)
Monocytes Absolute: 0.5 10*3/uL (ref 0.1–1.0)
Neutro Abs: 2.6 10*3/uL (ref 1.4–7.7)
Neutrophils Relative %: 46.6 % (ref 43.0–77.0)
Platelets: 181 10*3/uL (ref 150.0–400.0)
RBC: 4.41 Mil/uL (ref 4.22–5.81)
RDW: 13 % (ref 11.5–15.5)
WBC: 5.5 10*3/uL (ref 4.0–10.5)

## 2018-10-13 LAB — HEMOGLOBIN A1C: Hgb A1c MFr Bld: 11.2 % — ABNORMAL HIGH (ref 4.6–6.5)

## 2018-10-13 LAB — LIPID PANEL
Cholesterol: 198 mg/dL (ref 0–200)
HDL: 45 mg/dL (ref >39.00)
LDL Cholesterol: 136 mg/dL — ABNORMAL HIGH (ref 0–99)
NonHDL: 152.96
TRIGLYCERIDES: 84 mg/dL (ref 0.0–149.0)
Total CHOL/HDL Ratio: 4
VLDL: 16.8 mg/dL (ref 0.0–40.0)

## 2018-10-13 LAB — MICROALBUMIN / CREATININE URINE RATIO
Creatinine,U: 168.3 mg/dL
Microalb Creat Ratio: 0.5 mg/g (ref 0.0–30.0)
Microalb, Ur: 0.8 mg/dL (ref 0.0–1.9)

## 2018-10-13 NOTE — Progress Notes (Unsigned)
Labs only. Pt recently traveled to [place]. Lab staff took patient's temperature with consent of patient. (98.55F)  Masks worn by staff and patient during visit

## 2018-10-16 ENCOUNTER — Other Ambulatory Visit: Payer: Self-pay | Admitting: Family Medicine

## 2018-10-23 ENCOUNTER — Ambulatory Visit (INDEPENDENT_AMBULATORY_CARE_PROVIDER_SITE_OTHER): Payer: 59 | Admitting: Family Medicine

## 2018-10-23 ENCOUNTER — Encounter: Payer: Self-pay | Admitting: Family Medicine

## 2018-10-23 VITALS — Temp 98.8°F | Ht 73.0 in | Wt 246.0 lb

## 2018-10-23 DIAGNOSIS — E1165 Type 2 diabetes mellitus with hyperglycemia: Secondary | ICD-10-CM

## 2018-10-23 DIAGNOSIS — I10 Essential (primary) hypertension: Secondary | ICD-10-CM

## 2018-10-23 MED ORDER — METOPROLOL TARTRATE 50 MG PO TABS: ORAL_TABLET | ORAL | 0 refills | Status: DC

## 2018-10-23 NOTE — Assessment & Plan Note (Signed)
>  25 minutes spent in face to face time with patient, >50% spent in counselling or coordination of care discussing DM, HTN, HLD> He does feel his DM is mproving with levemir and now with increased physical activity. a1c is still very high but heading in the right direction. No changes made today.  BP was well controlled when he was in the office recently.   He will follow up with me in 3 months. The patient indicates understanding of these issues and agrees with the plan.

## 2018-10-23 NOTE — Progress Notes (Signed)
Virtual Visit via Video   I connected with Travis Scott on 10/23/18 at  9:20 AM EDT by a video enabled telemedicine application and verified that I am speaking with the correct person using two identifiers. Location patient: Home Location provider: [facility], Office Persons participating in the virtual visit: Travis Scott, Ruthe Mannanalia Ammaar Encina, MD   I discussed the limitations of evaluation and management by telemedicine and the availability of in person appointments. The patient expressed understanding and agreed to proceed.  Subjective:   HPI:   Still working because is considered essential. They are making accommodations at work.  He has been compliant with medications.  He was seen by Dr. Ashley RoyaltyMatthews on 3/17- note reviewed. His cough has been improving.  HTN-diagnosed as a teenager. On Metoprolol 50 mg twice daily, losartan/hctz 100/25 mg daily, and amlodipine 10 mg daily.  No CP, SOB or blurred vision.  BP Readings from Last 3 Encounters:  09/30/18 (!) 128/92  09/22/18 134/82  06/30/18 132/84   DM-  Currently taking Metformin XR 750 mg daily with breakfast, glyburide 1.25 mg daily, restarted levimir 80- 100 units per night. Has been checking FSBS daily, running 120- 150.  Hasn't had any e[ospdes pf hypoglycemia and started getting more active again which always helps with his blood sugars.  Just restarted levemir a month and a half ago.  Lab Results  Component Value Date   CREATININE 0.81 10/13/2018    Lab Results  Component Value Date   HGBA1C 11.2 (H) 10/13/2018   HLD- on lopid. Lab Results  Component Value Date   CHOL 198 10/13/2018   HDL 45.00 10/13/2018   LDLCALC 136 (H) 10/13/2018   LDLDIRECT 191.3 04/10/2011   TRIG 84.0 10/13/2018   CHOLHDL 4 10/13/2018   Review of Systems  Constitutional: Negative.   HENT: Negative.   Respiratory: Positive for cough. Negative for apnea, choking, chest tightness, shortness of breath, wheezing and stridor.    Cardiovascular: Negative.   Gastrointestinal: Negative.   Musculoskeletal: Negative.   Allergic/Immunologic: Negative.   Neurological: Negative.   Hematological: Negative.   Psychiatric/Behavioral: Negative.   All other systems reviewed and are negative.    ROS: See pertinent positives and negatives per HPI.  Patient Active Problem List   Diagnosis Date Noted  . Obesity 04/15/2014  . Hypoglycemia associated with type 2 diabetes mellitus (HCC) 04/15/2014  . Tobacco abuse 09/07/2013  . Diabetic neuropathy (HCC) 08/14/2012  . Low testosterone 08/14/2012  . Erectile dysfunction 08/14/2012  . Diabetes mellitus out of control (HCC) 12/06/2011  . Fatigue 04/10/2011  . Hyperlipidemia 04/10/2011  . Hypertension   . Esophageal reflux 07/25/2010    Social History   Tobacco Use  . Smoking status: Former Games developermoker  . Smokeless tobacco: Never Used  Substance Use Topics  . Alcohol use: No    Current Outpatient Medications:  .  albuterol (PROVENTIL HFA;VENTOLIN HFA) 108 (90 Base) MCG/ACT inhaler, Inhale 2 puffs into the lungs every 6 (six) hours as needed. , Disp: , Rfl:  .  amLODipine (NORVASC) 10 MG tablet, TAKE 1 TABLET BY MOUTH EVERY DAY, Disp: 30 tablet, Rfl: 2 .  gemfibrozil (LOPID) 600 MG tablet, Take 1 tablet (600 mg total) by mouth 2 (two) times daily., Disp: 180 tablet, Rfl: 1 .  glyBURIDE (DIABETA) 1.25 MG tablet, TAKE 1 TABLET BY MOUTH EVERY MORNING, Disp: 30 tablet, Rfl: 0 .  LEVEMIR 100 UNIT/ML injection, INJECT 85 UNITS UNDER THE SKIN DAILY AT BEDTIME. NEXT REFILL WILL BE  GIVEN AT OFFICE VISIT, Disp: 30 mL, Rfl: 0 .  losartan-hydrochlorothiazide (HYZAAR) 100-25 MG tablet, TAKE 1 TABLET BY MOUTH EVERY DAY, Disp: 30 tablet, Rfl: 0 .  metFORMIN (GLUCOPHAGE XR) 750 MG 24 hr tablet, Take 1 tablet (750 mg total) by mouth daily with breakfast., Disp: 30 tablet, Rfl: 3 .  metoprolol tartrate (LOPRESSOR) 50 MG tablet, Take 1bid, Disp: 60 tablet, Rfl: 0 .  pantoprazole (PROTONIX) 40  MG tablet, Take 1 tablet (40 mg total) by mouth daily., Disp: 30 tablet, Rfl: 0  No Known Allergies  Objective:  Temp 98.8 F (37.1 C) (Oral)   Ht 6\' 1"  (1.854 m)   Wt 246 lb (111.6 kg)   BMI 32.46 kg/m   VITALS: Per patient if applicable, see vitals. GENERAL: Alert, appears well and in no acute distress. HEENT: Atraumatic, conjunctiva clear, no obvious abnormalities on inspection of external nose and ears. NECK: Normal movements of the head and neck. CARDIOPULMONARY: No increased WOB. Speaking in clear sentences. I:E ratio WNL.  MS: Moves all visible extremities without noticeable abnormality. PSYCH: Pleasant and cooperative, well-groomed. Speech normal rate and rhythm. Affect is appropriate. Insight and judgement are appropriate. Attention is focused, linear, and appropriate.  NEURO: CN grossly intact. Oriented as arrived to appointment on time with no prompting. Moves both UE equally.  SKIN: No obvious lesions, wounds, erythema, or cyanosis noted on face or hands.  Assessment and Plan:   Tychicus was seen today for follow-up.  Diagnoses and all orders for this visit:  Uncontrolled type 2 diabetes mellitus with hyperglycemia (HCC)  Essential hypertension  Other orders -     metoprolol tartrate (LOPRESSOR) 50 MG tablet; Take 1bid    . Reviewed expectations re: course of current medical issues. . Discussed self-management of symptoms. . Outlined signs and symptoms indicating need for more acute intervention. . Patient verbalized understanding and all questions were answered. Marland Kitchen Health Maintenance issues including appropriate healthy diet, exercise, and smoking avoidance were discussed with patient. . See orders for this visit as documented in the electronic medical record.  Ruthe Mannan, MD 10/23/2018

## 2018-11-28 ENCOUNTER — Other Ambulatory Visit: Payer: Self-pay | Admitting: Family Medicine

## 2019-01-28 IMAGING — US US PELVIS LIMITED
1 series · 14 of 25 positions shown · non-contrast
Comparison: None.

CLINICAL DATA: The patient underwent incision and drainage of a
pole under his left buttock 08/30/2017. Now with mild swelling at
the area of incision. Pain and erythema.

EXAM:
LIMITED ULTRASOUND OF PELVIS
TECHNIQUE: Limited transabdominal ultrasound examination of the pelvis was
performed.

[Series 1: us pelvis limited · 0.07mm/px · 14 of 38 slices shown]
[im 1/38]
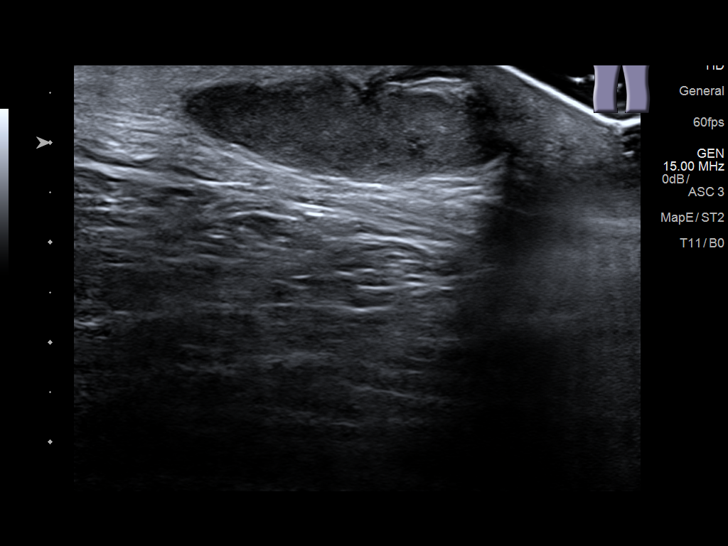
[im 4/38]
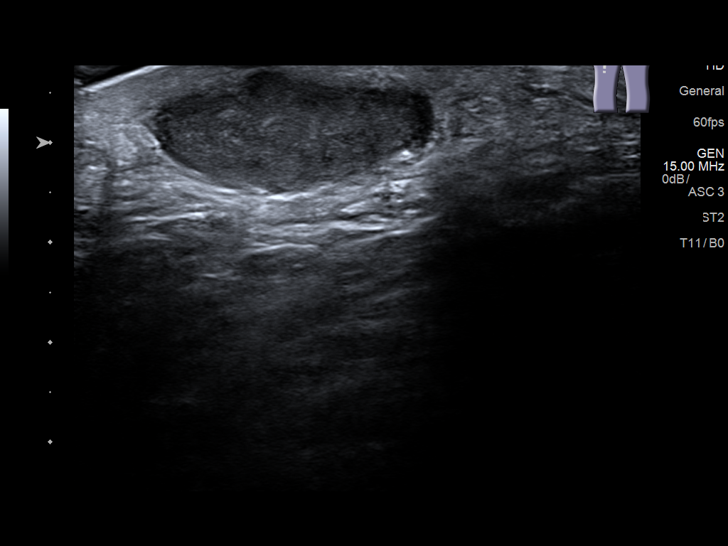
[im 7/38]
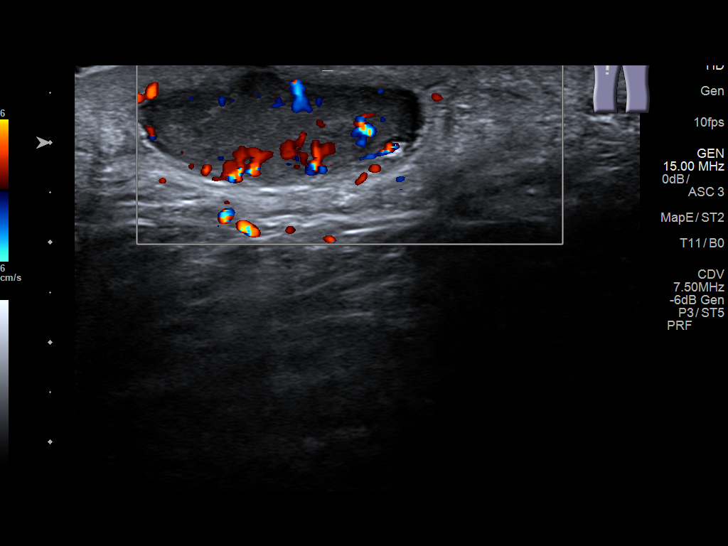
[im 10/38]
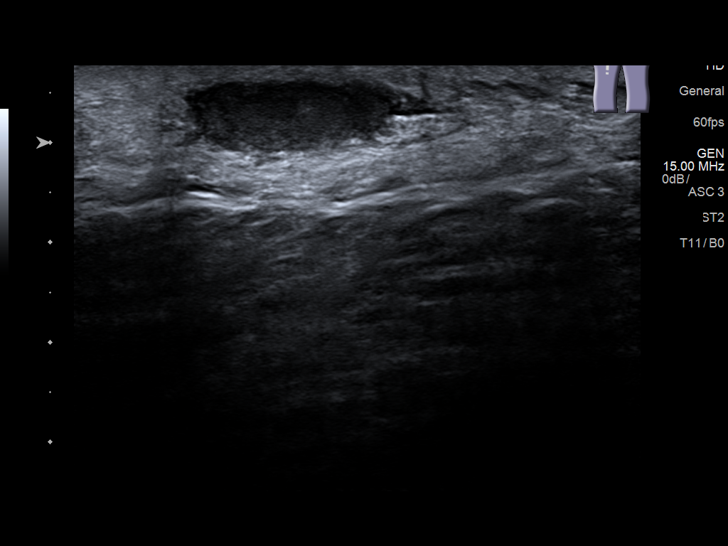
[im 13/38]
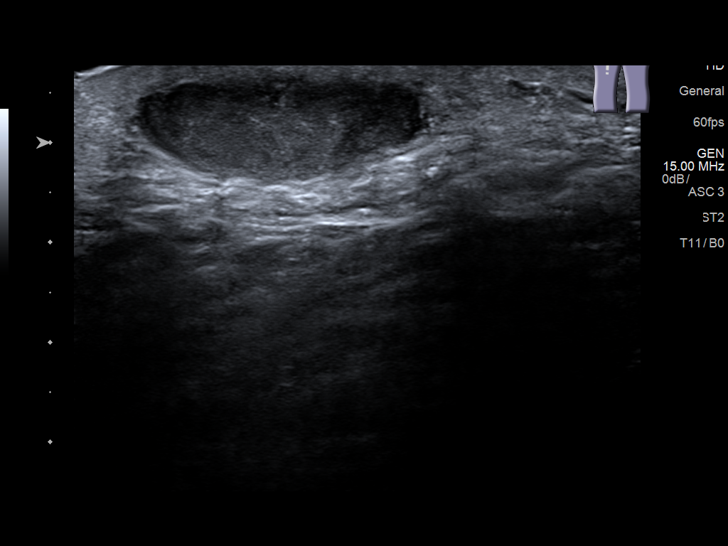
[im 14/38]
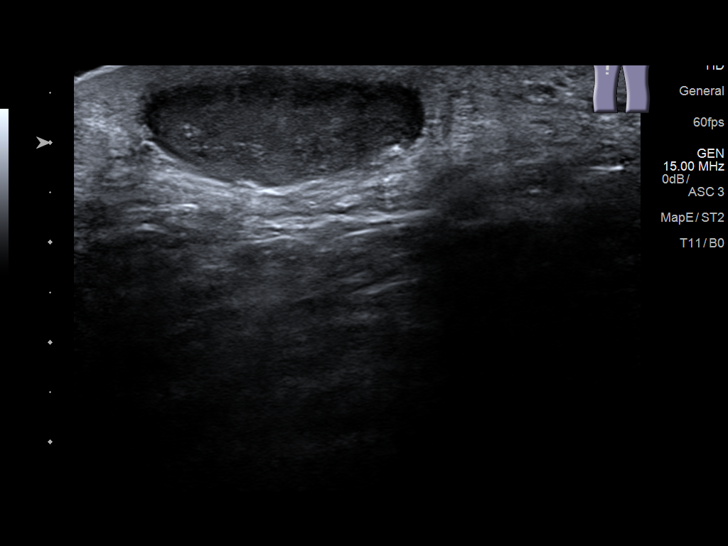
[im 17/38]
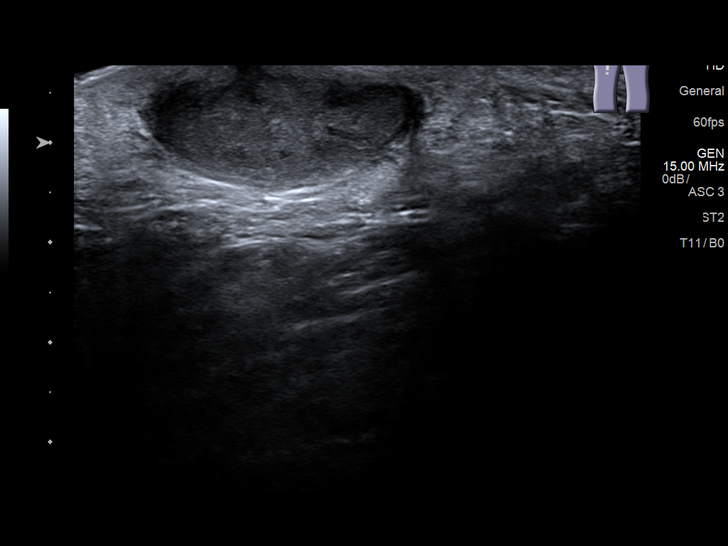
[im 21/38]
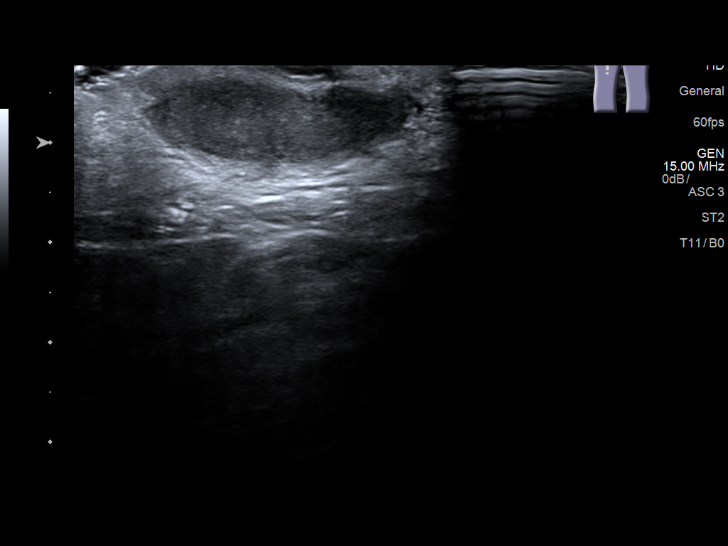
[im 24/38]
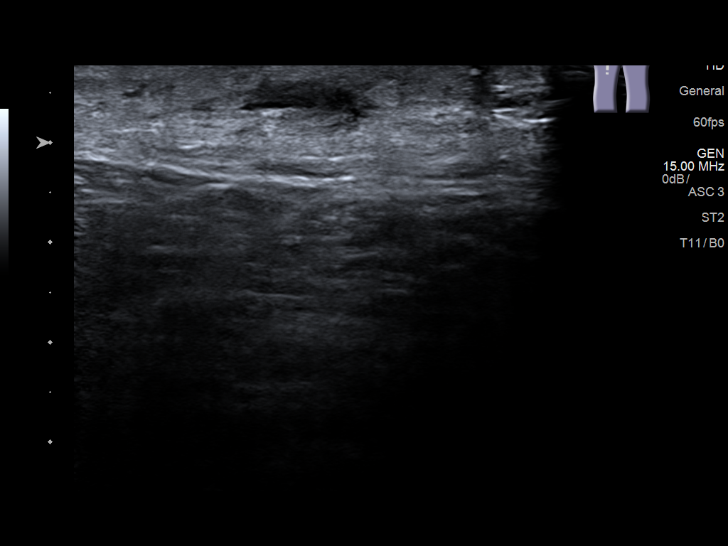
[im 25/38]
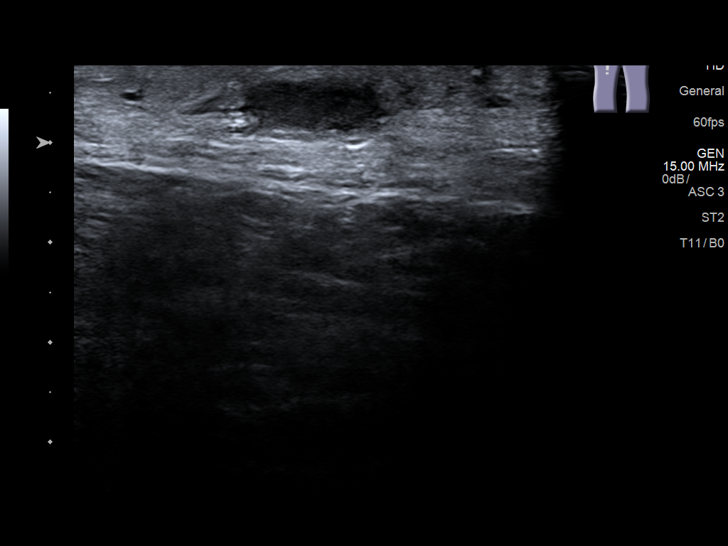
[im 28/38]
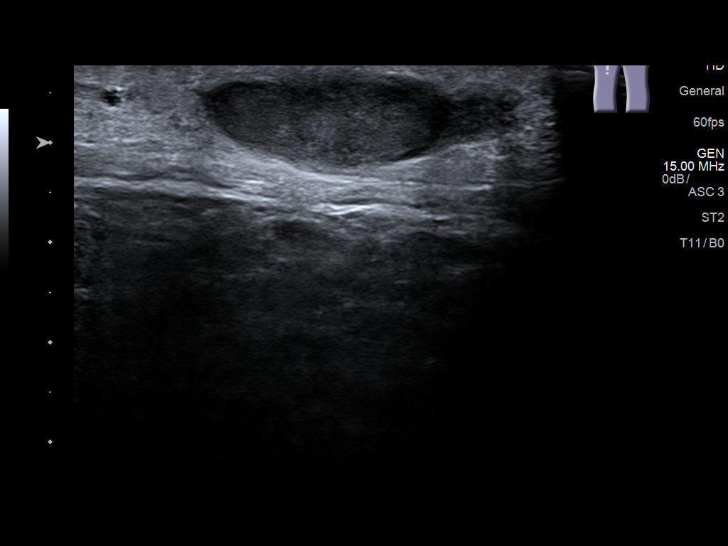
[im 31/38]
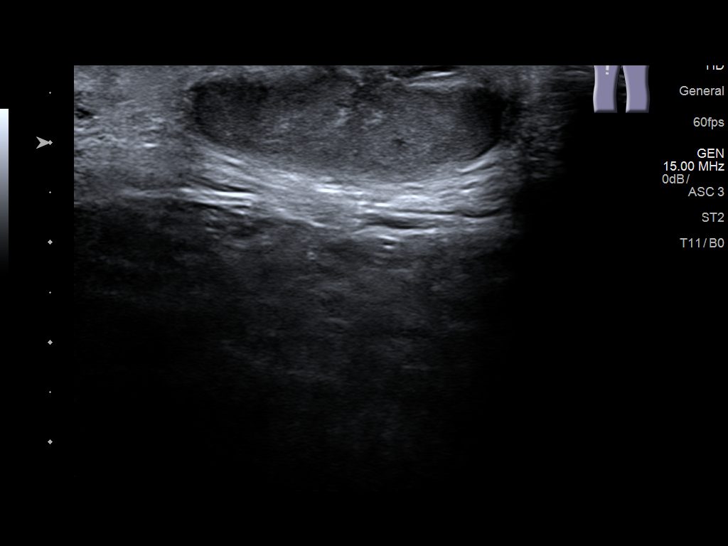
[im 34/38]
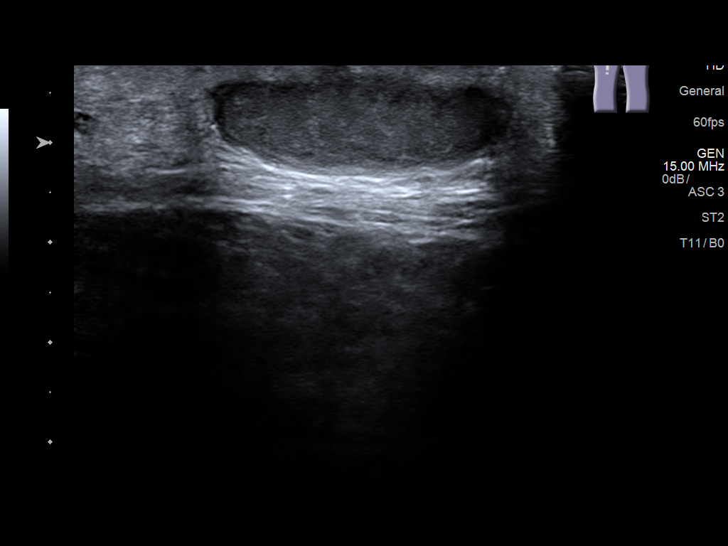
[im 38/38]
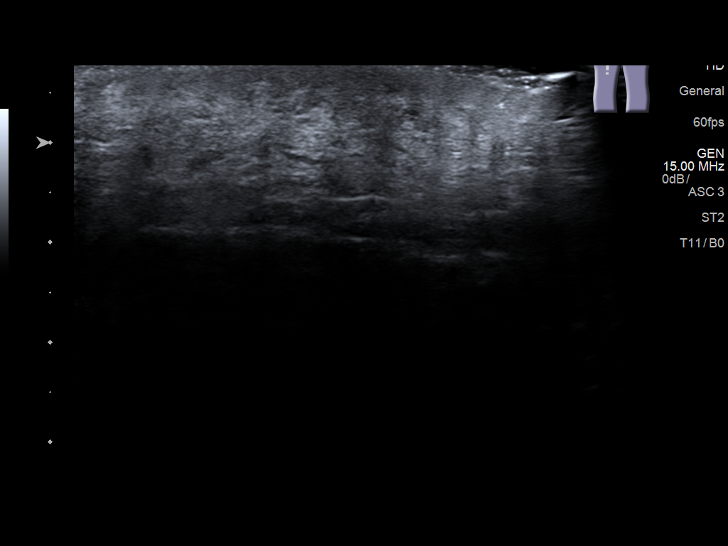

[14 of 25 positions shown; findings below may reference images not displayed]

FINDINGS: Scanning was directed toward the region of concern at the lower left
buttock. A well-defined abnormal area measuring 2.8 x 1.1 x 3.1 cm
is identified in the shallow subcutaneous tissues. Echogenic
material is present within the area.
IMPRESSION: Findings most consistent with recurrent abscess in the region of
concern. Echogenic material within the collection is likely complex
fluid. There may be packing present.

## 2019-03-06 ENCOUNTER — Other Ambulatory Visit: Payer: Self-pay | Admitting: Family Medicine

## 2019-04-22 ENCOUNTER — Other Ambulatory Visit: Payer: Self-pay

## 2019-04-22 MED ORDER — GLYBURIDE 1.25 MG PO TABS: 1.2500 mg | ORAL_TABLET | Freq: Every morning | ORAL | 0 refills | Status: DC

## 2019-04-23 ENCOUNTER — Other Ambulatory Visit: Payer: Self-pay

## 2019-04-23 MED ORDER — LOSARTAN POTASSIUM-HCTZ 100-25 MG PO TABS: 1.0000 | ORAL_TABLET | Freq: Every day | ORAL | 2 refills | Status: DC

## 2019-05-14 ENCOUNTER — Telehealth: Payer: Self-pay | Admitting: Family Medicine

## 2019-05-14 DIAGNOSIS — E1165 Type 2 diabetes mellitus with hyperglycemia: Secondary | ICD-10-CM

## 2019-05-14 NOTE — Telephone Encounter (Signed)
Medication: glyBURIDE (DIABETA) 1.25 MG tablet [[id number]] apt scheduled May 25, 2019, metFORMIN (GLUCOPHAGE-XR) 750 MG 24 hr tablet [[id number]]  amLODipine (NORVASC) 10 MG tablet [[id number]] LEVEMIR 100 UNIT/ML injection [[id number]]   Has the patient contacted their pharmacy? Yes  (Agent: If no, request that the patient contact the pharmacy for the refill.) (Agent: If yes, when and what did the pharmacy advise?)  Preferred Pharmacy (with phone number or street name): [id number] [address], [place] [phone] (Phone) [phone] (Fax)    Agent: Please be advised that RX refills may take up to 3 business days. We ask that you follow-up with your pharmacy.

## 2019-05-15 MED ORDER — METFORMIN HCL ER 750 MG PO TB24: ORAL_TABLET | ORAL | 0 refills | Status: DC

## 2019-05-15 MED ORDER — AMLODIPINE BESYLATE 10 MG PO TABS: 10.0000 mg | ORAL_TABLET | Freq: Every day | ORAL | 0 refills | Status: DC

## 2019-05-15 MED ORDER — GLYBURIDE 1.25 MG PO TABS: 1.2500 mg | ORAL_TABLET | Freq: Every morning | ORAL | 0 refills | Status: DC

## 2019-05-15 MED ORDER — INSULIN DETEMIR 100 UNIT/ML ~~LOC~~ SOLN: SUBCUTANEOUS | 0 refills | Status: DC

## 2019-05-15 NOTE — Telephone Encounter (Signed)
Last fill for  Glyburide 1.25MG  04/22/19  #15/0 Metformin 750MG  09/28/18 #30/2 Amlodipine 10MG   03/09/19  #30/2 Levemir 100unit  09/23/18  #39ml/0  Last OV 10/23/18 Next OV 05/25/19

## 2019-05-25 ENCOUNTER — Ambulatory Visit: Payer: 59 | Admitting: Family Medicine

## 2019-06-01 ENCOUNTER — Encounter: Payer: Self-pay | Admitting: Family Medicine

## 2019-06-01 ENCOUNTER — Other Ambulatory Visit: Payer: Self-pay

## 2019-06-01 ENCOUNTER — Ambulatory Visit: Payer: 59 | Admitting: Family Medicine

## 2019-06-01 VITALS — BP 140/90 | HR 79 | Temp 98.7°F | Ht 71.75 in | Wt 246.8 lb

## 2019-06-01 DIAGNOSIS — Z23 Encounter for immunization: Secondary | ICD-10-CM

## 2019-06-01 DIAGNOSIS — R198 Other specified symptoms and signs involving the digestive system and abdomen: Secondary | ICD-10-CM | POA: Insufficient documentation

## 2019-06-01 DIAGNOSIS — E1141 Type 2 diabetes mellitus with diabetic mononeuropathy: Secondary | ICD-10-CM

## 2019-06-01 DIAGNOSIS — E1165 Type 2 diabetes mellitus with hyperglycemia: Secondary | ICD-10-CM

## 2019-06-01 DIAGNOSIS — I10 Essential (primary) hypertension: Secondary | ICD-10-CM | POA: Diagnosis not present

## 2019-06-01 DIAGNOSIS — E785 Hyperlipidemia, unspecified: Secondary | ICD-10-CM | POA: Diagnosis not present

## 2019-06-01 MED ORDER — GLYBURIDE 1.25 MG PO TABS: 1.2500 mg | ORAL_TABLET | Freq: Every morning | ORAL | 0 refills | Status: DC

## 2019-06-01 MED ORDER — INSULIN DETEMIR 100 UNIT/ML ~~LOC~~ SOLN: SUBCUTANEOUS | 0 refills | Status: DC

## 2019-06-01 MED ORDER — METOCLOPRAMIDE HCL 5 MG PO TABS: 5.0000 mg | ORAL_TABLET | Freq: Three times a day (TID) | ORAL | 3 refills | Status: DC

## 2019-06-01 MED ORDER — GEMFIBROZIL 600 MG PO TABS: 600.0000 mg | ORAL_TABLET | Freq: Two times a day (BID) | ORAL | 1 refills | Status: DC

## 2019-06-01 NOTE — Assessment & Plan Note (Signed)
Continue current rxs.  Check labs today. 

## 2019-06-01 NOTE — Patient Instructions (Addendum)
Great to see you. I will call you with your lab results from today and you can view them online.    Gastroparesis  Gastroparesis is a condition in which food takes longer than normal to empty from the stomach. The condition is usually long-lasting (chronic). It may also be called delayed gastric emptying. There is no cure, but there are treatments and things that you can do at home to help relieve symptoms. Treating the underlying condition that causes gastroparesis can also help relieve symptoms. What are the causes? In many cases, the cause of this condition is not known. Possible causes include:  A hormone (endocrine) disorder, such as hypothyroidism or diabetes.  A nervous system disease, such as Parkinson's disease or multiple sclerosis.  Cancer, infection, or surgery that affects the stomach or vagus nerve. The vagus nerve runs from your chest, through your neck, to the lower part of your brain.  A connective tissue disorder, such as scleroderma.  Certain medicines. What increases the risk? You are more likely to develop this condition if you:  Have certain disorders or diseases, including: ? An endocrine disorder. ? An eating disorder. ? Amyloidosis. ? Scleroderma. ? Parkinson's disease. ? Multiple sclerosis. ? Cancer or infection of the stomach or the vagus nerve.  Have had surgery on the stomach or vagus nerve.  Take certain medicines.  Are male. What are the signs or symptoms? Symptoms of this condition include:  Feeling full after eating very little.  Nausea.  Vomiting.  Heartburn.  Abdominal bloating.  Inconsistent blood sugar (glucose) levels on blood tests.  Lack of appetite.  Weight loss.  Acid from the stomach coming up into the esophagus (gastroesophageal reflux).  Sudden tightening (spasm) of the stomach, which can be painful. Symptoms may come and go. Some people may not notice any symptoms. How is this diagnosed? This condition is  diagnosed with tests, such as:  Tests that check how long it takes food to move through the stomach and intestines. These tests include: ? Upper gastrointestinal (GI) series. For this test, you drink a liquid that shows up well on X-rays, and then X-rays will be taken of your intestines. ? Gastric emptying scintigraphy. For this test, you eat food that contains a small amount of radioactive material, and then scans are taken. ? Wireless capsule GI monitoring system. For this test, you swallow a pill (capsule) that records information about how foods and fluid move through your stomach.  Gastric manometry. For this test, a tube is passed down your throat and into your stomach to measure electrical and muscular activity.  Endoscopy. For this test, a long, thin tube is passed down your throat and into your stomach to check for problems in your stomach lining.  Ultrasound. This test uses sound waves to create images of inside the body. This can help rule out gallbladder disease or pancreatitis as a cause of your symptoms. How is this treated? There is no cure for gastroparesis. Treatment may include:  Treating the underlying cause.  Managing your symptoms by making changes to your diet and exercise habits.  Taking medicines to control nausea and vomiting and to stimulate stomach muscles.  Getting food through a feeding tube in the hospital. This may be done in severe cases.  Having surgery to insert a device into your body that helps improve stomach emptying and control nausea and vomiting (gastric neurostimulator). Follow these instructions at home:  Take over-the-counter and prescription medicines only as told by your health care provider.  Follow instructions from your health care provider about eating or drinking restrictions. Your health care provider may recommend that you: ? Eat smaller meals more often. ? Eat low-fat foods. ? Eat low-fiber forms of high-fiber foods. For example,  eat cooked vegetables instead of raw vegetables. ? Have only liquid foods instead of solid foods. Liquid foods are easier to digest.  Drink enough fluid to keep your urine pale yellow.  Exercise as often as told by your health care provider.  Keep all follow-up visits as told by your health care provider. This is important. Contact a health care provider if you:  Notice that your symptoms do not improve with treatment.  Have new symptoms. Get help right away if you:  Have severe abdominal pain that does not improve with treatment.  Have nausea that is severe or does not go away.  Cannot drink fluids without vomiting. Summary  Gastroparesis is a chronic condition in which food takes longer than normal to empty from the stomach.  Symptoms include nausea, vomiting, heartburn, abdominal bloating, and loss of appetite.  Eating smaller portions, and low-fat, low-fiber foods may help you manage your symptoms.  Get help right away if you have severe abdominal pain. This information is not intended to replace advice given to you by your health care provider. Make sure you discuss any questions you have with your health care provider. Document Released: 07/02/2005 Document Revised: 09/30/2017 Document Reviewed: 05/07/2017 Elsevier Patient Education  2020 [employer].

## 2019-06-01 NOTE — Assessment & Plan Note (Signed)
Most consistent with gastroparesis- will try reglan three times daily with meal for two weeks.  He will update me if no improvement.  No red flag symptoms.  If symptoms continue, refer to GI for further work up/scope. The patient indicates understanding of these issues and agrees with the plan.

## 2019-06-01 NOTE — Assessment & Plan Note (Signed)
At goal for diabetic. 

## 2019-06-01 NOTE — Assessment & Plan Note (Addendum)
Improved control, likely but will check a1c today. Foot exam today. Eye exam scheduled. On ARB.Marland Kitchen

## 2019-06-01 NOTE — Progress Notes (Signed)
Subjective:   Patient ID: Travis Scott, male    DOB: September 04, 1980, 39 y.o.   MRN: [id number]  Travis Scott is a pleasant 38 y.o. year old male who presents to clinic today with Diabetes (Pt is here today to F/U with DM. His last A1C on 3.30.20 was 11.2. He is needing a foot exam, which he would like Dr. Deborra Medina to have a look at both big toes and flu shot. Advised to schedule an eye exam.  Pt is not fasting for labs today.) and Abdominal Pain (Pt c/o having an upset stomach x 3 months, comes and goes.)  on 06/01/2019  HPI:  Still working because is considered essential. They are making accommodations at work.  He has been compliant with medications.  HTN-diagnosed as a teenager. On Metoprolol 50 mg twice daily, losartan/hctz 100/25 mg daily, and amlodipine 10 mg daily.  No CP, SOB or blurred vision.  BP Readings from Last 3 Encounters:  06/01/19 140/90  09/30/18 (!) 128/92  09/22/18 134/82   Abdominal distension/urgency to have BMs- intermittent for months.  Has not had any recent blood in his stool.  No nausea or vomiting.  No known family history of IBD or Colon CA.  Greasy food makes it worse but now he feels all foods can trigger it.  No bowel incontinence- does not feel it is really diarrhea but he does have an urgency to have a BM shortly after he eats.  DM-  Currently taking Metformin XR 750 mg daily with breakfast, glyburide 1.25 mg daily, restarted levimir 80- 100 units per night. Has been checking FSBS daily, running 90s 120s-.  Hasn't had any e[ospdes pf hypoglycemia and started getting more active again which always helps with his blood sugars.  Lab Results  Component Value Date   HGBA1C 11.2 (H) 10/13/2018   Lab Results  Component Value Date   CHOL 198 10/13/2018   HDL 45.00 10/13/2018   LDLCALC 136 (H) 10/13/2018   LDLDIRECT 191.3 04/10/2011   TRIG 84.0 10/13/2018   CHOLHDL 4 10/13/2018   The 10-year ASCVD risk score Mikey Bussing DC Jr., et al., 2013) is: 11.6%  Values used to calculate the score:     Age: 8 years     Sex: Male     Is Non-Hispanic African American: Yes     Diabetic: Yes     Tobacco smoker: No     Systolic Blood Pressure: 053 mmHg     Is BP treated: Yes     HDL Cholesterol: 45 mg/dL     Total Cholesterol: 198 mg/dL Current Outpatient Medications on File Prior to Visit  Medication Sig Dispense Refill  . amLODipine (NORVASC) 10 MG tablet Take 1 tablet (10 mg total) by mouth daily. 30 tablet 0  . losartan-hydrochlorothiazide (HYZAAR) 100-25 MG tablet Take 1 tablet by mouth daily. 30 tablet 2  . metFORMIN (GLUCOPHAGE-XR) 750 MG 24 hr tablet TAKE 1 TABLET(750 MG) BY MOUTH DAILY WITH BREAKFAST 30 tablet 0  . metoprolol tartrate (LOPRESSOR) 50 MG tablet TAKE 1 TABLET BY MOUTH TWICE DAILY 60 tablet 0  . albuterol (PROVENTIL HFA;VENTOLIN HFA) 108 (90 Base) MCG/ACT inhaler Inhale 2 puffs into the lungs every 6 (six) hours as needed.     . pantoprazole (PROTONIX) 40 MG tablet Take 1 tablet (40 mg total) by mouth daily. (Patient not taking: Reported on 06/01/2019) 30 tablet 0   No current facility-administered medications on file prior to visit.     No Known Allergies  Past Medical History:  Diagnosis Date  . Chronic back pain   . Diabetes mellitus   . Hypertension   . Kidney stones     Past Surgical History:  Procedure Laterality Date  . NO PAST SURGERIES      Family History  Problem Relation Age of Onset  . Hypertension Mother   . Diabetes Mother   . Diabetes Father   . Hypertension Father     Social History   Socioeconomic History  . Marital status: Married    Spouse name: Not on file  . Number of children: 2  . Years of education: Not on file  . Highest education level: Not on file  Occupational History    Employer: [employer]  Social Needs  . Financial resource strain: Not on file  . Food insecurity    Worry: Not on file    Inability: Not on file  . Transportation needs    Medical: Not on file     Non-medical: Not on file  Tobacco Use  . Smoking status: Former Research scientist (life sciences)  . Smokeless tobacco: Never Used  Substance and Sexual Activity  . Alcohol use: No  . Drug use: No  . Sexual activity: Yes  Lifestyle  . Physical activity    Days per week: Not on file    Minutes per session: Not on file  . Stress: Not on file  Relationships  . Social Herbalist on phone: Not on file    Gets together: Not on file    Attends religious service: Not on file    Active member of club or organization: Not on file    Attends meetings of clubs or organizations: Not on file    Relationship status: Not on file  . Intimate partner violence    Fear of current or ex partner: Not on file    Emotionally abused: Not on file    Physically abused: Not on file    Forced sexual activity: Not on file  Other Topics Concern  . Not on file  Social History Narrative  . Not on file   The PMH, PSH, Social History, Family History, Medications, and allergies have been reviewed in [facility], and have been updated if relevant.    Review of Systems  Constitutional: Negative.   HENT: Negative.   Eyes: Negative.   Respiratory: Negative.   Cardiovascular: Negative.   Gastrointestinal: Positive for abdominal distention and diarrhea. Negative for abdominal pain, anal bleeding, blood in stool, nausea, rectal pain and vomiting.  Endocrine: Negative.   Genitourinary: Negative.   Musculoskeletal: Negative.   Allergic/Immunologic: Negative.   Neurological: Negative.   Hematological: Negative.   Psychiatric/Behavioral: Negative.   All other systems reviewed and are negative.      Objective:    BP 140/90 (BP Location: Left Arm, Patient Position: Sitting, Cuff Size: Normal)   Pulse 79   Temp 98.7 F (37.1 C) (Oral)   Ht 5' 11.75" (1.822 m)   Wt 246 lb 12.8 oz (111.9 kg)   SpO2 96%   BMI 33.71 kg/m   Wt Readings from Last 3 Encounters:  06/01/19 246 lb 12.8 oz (111.9 kg)  10/23/18 246 lb (111.6 kg)   09/30/18 248 lb (112.5 kg)     Physical Exam  General:  pleasant male in no acute distress Eyes:  PERRL Ears:  External ear exam shows no significant lesions or deformities.  TMs normal bilaterally Hearing is grossly normal bilaterally. Nose:  External nasal  examination shows no deformity or inflammation. Nasal mucosa are pink and moist without lesions or exudates. Mouth:  Oral mucosa and oropharynx without lesions or exudates.  Teeth in good repair. Neck:  no carotid bruit or thyromegaly no cervical or supraclavicular lymphadenopathy  Lungs:  Normal respiratory effort, chest expands symmetrically. Lungs are clear to auscultation, no crackles or wheezes. Heart:  Normal rate and regular rhythm. S1 and S2 normal without gallop, murmur, click, rub or other extra sounds. Abdomen:  Bowel sounds positive,abdomen soft and non-tender without masses, organomegaly or hernias noted.. Pulses:  R and L posterior tibial pulses are full and equal bilaterally  Extremities:  no edema  Psych:  Good eye contact, not anxious or depressed appearing        Assessment & Plan:   Uncontrolled type 2 diabetes mellitus with hyperglycemia (HCC) - Plan: Comp Met (CMET), CBC w/Diff, HgB A1c, insulin detemir (LEVEMIR) 100 UNIT/ML injection  Hyperglycemia due to diabetes mellitus ([place]) - Plan: Comp Met (CMET), CBC w/Diff, HgB A1c  Hyperlipidemia, unspecified hyperlipidemia type - Plan: Comp Met (CMET), CBC w/Diff, HgB A1c, Lipid panel  Essential hypertension - Plan: Comp Met (CMET), CBC w/Diff, HgB A1c  Diabetic mononeuropathy associated with type 2 diabetes mellitus ([place]) - Plan: Comp Met (CMET), CBC w/Diff, HgB A1c  Need for immunization against influenza - Plan: Flu Vaccine QUAD 36+ mos IM  GI symptoms No follow-ups on file.

## 2019-06-02 LAB — COMPREHENSIVE METABOLIC PANEL
ALT: 17 U/L (ref 0–53)
AST: 19 U/L (ref 0–37)
Albumin: 4.6 g/dL (ref 3.5–5.2)
Alkaline Phosphatase: 57 U/L (ref 39–117)
BUN: 16 mg/dL (ref 6–23)
CO2: 27 mEq/L (ref 19–32)
Calcium: 9.7 mg/dL (ref 8.4–10.5)
Chloride: 101 mEq/L (ref 96–112)
Creatinine, Ser: 0.99 mg/dL (ref 0.40–1.50)
GFR: 101.05 mL/min (ref >60.00)
Glucose, Bld: 107 mg/dL — ABNORMAL HIGH (ref 70–99)
Potassium: 4 mEq/L (ref 3.5–5.1)
Sodium: 138 mEq/L (ref 135–145)
Total Bilirubin: 0.7 mg/dL (ref 0.2–1.2)
Total Protein: 7.4 g/dL (ref 6.0–8.3)

## 2019-06-02 LAB — CBC WITH DIFFERENTIAL/PLATELET
Basophils Absolute: 0.1 10*3/uL (ref 0.0–0.1)
Basophils Relative: 1.2 % (ref 0.0–3.0)
Eosinophils Absolute: 0.2 10*3/uL (ref 0.0–0.7)
Eosinophils Relative: 3.6 % (ref 0.0–5.0)
HCT: 42.1 % (ref 39.0–52.0)
Hemoglobin: 13.9 g/dL (ref 13.0–17.0)
Lymphocytes Relative: 35.7 % (ref 12.0–46.0)
Lymphs Abs: 2.1 10*3/uL (ref 0.7–4.0)
MCHC: 32.9 g/dL (ref 30.0–36.0)
MCV: 88.3 fl (ref 78.0–100.0)
Monocytes Absolute: 0.5 10*3/uL (ref 0.1–1.0)
Monocytes Relative: 7.9 % (ref 3.0–12.0)
Neutro Abs: 3.1 10*3/uL (ref 1.4–7.7)
Neutrophils Relative %: 51.6 % (ref 43.0–77.0)
Platelets: 186 10*3/uL (ref 150.0–400.0)
RBC: 4.77 Mil/uL (ref 4.22–5.81)
RDW: 12.6 % (ref 11.5–15.5)
WBC: 6 10*3/uL (ref 4.0–10.5)

## 2019-06-02 LAB — LIPID PANEL
Cholesterol: 197 mg/dL (ref 0–200)
HDL: 50.1 mg/dL (ref >39.00)
LDL Cholesterol: 131 mg/dL — ABNORMAL HIGH (ref 0–99)
NonHDL: 146.57
Total CHOL/HDL Ratio: 4
Triglycerides: 78 mg/dL (ref 0.0–149.0)
VLDL: 15.6 mg/dL (ref 0.0–40.0)

## 2019-06-02 LAB — HEMOGLOBIN A1C: Hgb A1c MFr Bld: 9.1 % — ABNORMAL HIGH (ref 4.6–6.5)

## 2019-06-29 ENCOUNTER — Other Ambulatory Visit: Payer: Self-pay

## 2019-06-29 MED ORDER — GLYBURIDE 1.25 MG PO TABS: 1.2500 mg | ORAL_TABLET | Freq: Every morning | ORAL | 0 refills | Status: DC

## 2019-06-29 MED ORDER — AMLODIPINE BESYLATE 10 MG PO TABS: 10.0000 mg | ORAL_TABLET | Freq: Every day | ORAL | 0 refills | Status: DC

## 2019-06-29 MED ORDER — METOPROLOL TARTRATE 50 MG PO TABS: ORAL_TABLET | ORAL | 0 refills | Status: DC

## 2019-06-29 MED ORDER — METFORMIN HCL ER 750 MG PO TB24: ORAL_TABLET | ORAL | 0 refills | Status: DC

## 2019-06-29 NOTE — Telephone Encounter (Signed)
Last OV 06/01/19 Last fill Metformin 05/15/19 #30/0 Last fill Metoprolol 03/09/19  #60/0 Last fill Amlodipine 05/15/19 #30/0 Last fill Glyburide 06/01/19 #15/0

## 2019-07-29 ENCOUNTER — Other Ambulatory Visit: Payer: Self-pay

## 2019-07-29 MED ORDER — GLYBURIDE 1.25 MG PO TABS: 1.2500 mg | ORAL_TABLET | Freq: Every morning | ORAL | 1 refills | Status: DC

## 2019-08-18 ENCOUNTER — Other Ambulatory Visit: Payer: Self-pay

## 2019-08-18 MED ORDER — METFORMIN HCL ER 750 MG PO TB24: ORAL_TABLET | ORAL | 0 refills | Status: DC

## 2019-08-19 ENCOUNTER — Telehealth: Payer: Self-pay

## 2019-08-19 NOTE — Assessment & Plan Note (Signed)
History:   He currently takes Amlodipine 10mg  daily, Losartan-HCT 100/25mg  daily, and Metoprolol 50mg  twice daily and is compliant.   Review: taking medications as instructed, no medication side effects noted, no TIAs, no chest pain on exertion, no dyspnea on exertion, no swelling of ankles. Smoker: No.  BP Readings from Last 3 Encounters:  06/01/19 140/90  09/30/18 (!) 128/92  09/22/18 134/82   Lab Results  Component Value Date   CREATININE 0.99 06/01/2019     Assessment/Plan: 1. Medication: no change. 2. Dietary sodium restriction. 3. Regular aerobic exercise.

## 2019-08-19 NOTE — Telephone Encounter (Signed)
PA for Levemir initiated via CMM/thx dmf

## 2019-08-19 NOTE — Progress Notes (Signed)
Virtual Visit via Video   Due to the COVID-19 pandemic, this visit was completed with telemedicine (audio/video) technology to reduce patient and provider exposure as well as to preserve personal protective equipment.   I connected with Travis Scott by a video enabled telemedicine application and verified that I am speaking with the correct person using two identifiers. Location patient: Home Location provider: [facility], Office Persons participating in the virtual visit: Lavonte Palos Simpson, Arnette Norris, MD   I discussed the limitations of evaluation and management by telemedicine and the availability of in person appointments. The patient expressed understanding and agreed to proceed.  Care Team   Patient Care Team: Lucille Passy, MD as PCP - General (Family Medicine)  Subjective:   HPI: Patient is connecting today to follow-up. He is following up with his Diabetes. Last A1c was 9.1 on 11.16.20 down from 11.2.  He currently takes Glyburide 1.'25mg'$  1qam, Levemir 85u qhs, and Metformin XR '750mg'$  1qam and is compliant. A prior authorization was completed on 2.3.21 for his Levemir. He states that he is doing well on current regimen.  He will call back to schedule a lab visit in the near future.   See problem based charting.   Review of Systems  Constitutional: Negative for fever and malaise/fatigue.  HENT: Negative for congestion and hearing loss.   Eyes: Negative for blurred vision, discharge and redness.  Respiratory: Negative for cough and shortness of breath.   Cardiovascular: Negative for chest pain, palpitations and leg swelling.  Gastrointestinal: Negative for abdominal pain and heartburn.  Genitourinary: Negative for dysuria.  Musculoskeletal: Negative for falls.  Skin: Negative for rash.  Neurological: Negative for loss of consciousness and headaches.  Endo/Heme/Allergies: Does not bruise/bleed easily.  Psychiatric/Behavioral: Negative for depression and memory loss.   All other systems reviewed and are negative.    Patient Active Problem List   Diagnosis Date Noted  . GI symptoms 06/01/2019  . Obesity 04/15/2014  . Hypoglycemia associated with type 2 diabetes mellitus ([place]) 04/15/2014  . Tobacco abuse 09/07/2013  . Diabetic neuropathy ([place]) 08/14/2012  . Low testosterone 08/14/2012  . Erectile dysfunction 08/14/2012  . Diabetes mellitus out of control ([place]) 12/06/2011  . Fatigue 04/10/2011  . Hyperlipidemia 04/10/2011  . Hypertension   . Esophageal reflux 07/25/2010    Social History   Tobacco Use  . Smoking status: Former Research scientist (life sciences)  . Smokeless tobacco: Never Used  Substance Use Topics  . Alcohol use: No    Current Outpatient Medications:  .  amLODipine (NORVASC) 10 MG tablet, Take 1 tablet (10 mg total) by mouth daily., Disp: 90 tablet, Rfl: 1 .  gemfibrozil (LOPID) 600 MG tablet, Take 1 tablet (600 mg total) by mouth 2 (two) times daily., Disp: 180 tablet, Rfl: 1 .  glyBURIDE (DIABETA) 1.25 MG tablet, Take 1 tablet (1.25 mg total) by mouth every morning., Disp: 90 tablet, Rfl: 1 .  insulin detemir (LEVEMIR) 100 UNIT/ML injection, INJECT 85 UNITS UNDER THE SKIN DAILY AT BEDTIME. NEXT REFILL WILL BE GIVEN AT OFFICE VISIT, Disp: 30 mL, Rfl: 0 .  losartan-hydrochlorothiazide (HYZAAR) 100-25 MG tablet, Take 1 tablet by mouth daily., Disp: 90 tablet, Rfl: 1 .  metFORMIN (GLUCOPHAGE-XR) 750 MG 24 hr tablet, TAKE 1 TABLET(750 MG) BY MOUTH DAILY WITH BREAKFAST, Disp: 90 tablet, Rfl: 1 .  metoCLOPramide (REGLAN) 5 MG tablet, Take 1 tablet (5 mg total) by mouth 3 (three) times daily before meals., Disp: 90 tablet, Rfl: 3 .  metoprolol tartrate (  LOPRESSOR) 50 MG tablet, TAKE 1 TABLET BY MOUTH TWICE DAILY, Disp: 180 tablet, Rfl: 1 .  albuterol (PROVENTIL HFA;VENTOLIN HFA) 108 (90 Base) MCG/ACT inhaler, Inhale 2 puffs into the lungs every 6 (six) hours as needed. , Disp: , Rfl:  .  Insulin Glargine, 2 Unit Dial, (TOUJEO MAX SOLOSTAR) 300 UNIT/ML SOPN,  Inject 85 Units into the skin at bedtime., Disp: 3 pen, Rfl: 3  No Known Allergies  Objective:  Temp 98.6 F (37 C) (Oral)   VITALS: Per patient if applicable, see vitals. GENERAL: Alert, appears well and in no acute distress. HEENT: Atraumatic, conjunctiva clear, no obvious abnormalities on inspection of external nose and ears. NECK: Normal movements of the head and neck. CARDIOPULMONARY: No increased WOB. Speaking in clear sentences. I:E ratio WNL.  MS: Moves all visible extremities without noticeable abnormality. PSYCH: Pleasant and cooperative, well-groomed. Speech normal rate and rhythm. Affect is appropriate. Insight and judgement are appropriate. Attention is focused, linear, and appropriate.  NEURO: CN grossly intact. Oriented as arrived to appointment on time with no prompting. Moves both UE equally.  SKIN: No obvious lesions, wounds, erythema, or cyanosis noted on face or hands.  Depression screen [facility] 2/9 10/23/2018 06/30/2018 05/23/2017  Decreased Interest 0 0 0  Down, Depressed, Hopeless 0 0 0  PHQ - 2 Score 0 0 0     . COVID-19 Education: The signs and symptoms of COVID-19 were discussed with the patient and how to seek care for testing if needed. The importance of social distancing was discussed today. . Reviewed expectations re: course of current medical issues. . Discussed self-management of symptoms. . Outlined signs and symptoms indicating need for more acute intervention. . Patient verbalized understanding and all questions were answered. Marland Kitchen Health Maintenance issues including appropriate healthy diet, exercise, and smoking avoidance were discussed with patient. . See orders for this visit as documented in the electronic medical record.  Arnette Norris, MD  Records requested if needed. Time spent: 25 minutes, of which >50% was spent in obtaining information about his symptoms, reviewing his previous labs, evaluations, and treatments, counseling him about his condition  (please see the discussed topics above), and developing a plan to further investigate it; he had a number of questions which I addressed.   Lab Results  Component Value Date   WBC 6.0 06/01/2019   HGB 13.9 06/01/2019   HCT 42.1 06/01/2019   PLT 186.0 06/01/2019   GLUCOSE 107 (H) 06/01/2019   CHOL 197 06/01/2019   TRIG 78.0 06/01/2019   HDL 50.10 06/01/2019   LDLDIRECT 191.3 04/10/2011   LDLCALC 131 (H) 06/01/2019   ALT 17 06/01/2019   AST 19 06/01/2019   NA 138 06/01/2019   K 4.0 06/01/2019   CL 101 06/01/2019   CREATININE 0.99 06/01/2019   BUN 16 06/01/2019   CO2 27 06/01/2019   HGBA1C 9.1 (H) 06/01/2019   MICROALBUR 0.8 10/13/2018    No results found for: TSH Lab Results  Component Value Date   WBC 6.0 06/01/2019   HGB 13.9 06/01/2019   HCT 42.1 06/01/2019   MCV 88.3 06/01/2019   PLT 186.0 06/01/2019   Lab Results  Component Value Date   NA 138 06/01/2019   K 4.0 06/01/2019   CO2 27 06/01/2019   GLUCOSE 107 (H) 06/01/2019   BUN 16 06/01/2019   CREATININE 0.99 06/01/2019   BILITOT 0.7 06/01/2019   ALKPHOS 57 06/01/2019   AST 19 06/01/2019   ALT 17 06/01/2019   PROT  7.4 06/01/2019   ALBUMIN 4.6 06/01/2019   CALCIUM 9.7 06/01/2019   ANIONGAP 12 06/29/2013   GFR 101.05 06/01/2019   Lab Results  Component Value Date   CHOL 197 06/01/2019   Lab Results  Component Value Date   HDL 50.10 06/01/2019   Lab Results  Component Value Date   LDLCALC 131 (H) 06/01/2019   Lab Results  Component Value Date   TRIG 78.0 06/01/2019   Lab Results  Component Value Date   CHOLHDL 4 06/01/2019   Lab Results  Component Value Date   HGBA1C 9.1 (H) 06/01/2019       Assessment & Plan:   Problem List Items Addressed This Visit      Active Problems   Hypertension    History:   He currently takes Amlodipine 13m daily, Losartan-HCT 100/252mdaily, and Metoprolol 5040mwice daily and is compliant.   Review: taking medications as instructed, no medication  side effects noted, no TIAs, no chest pain on exertion, no dyspnea on exertion, no swelling of ankles. Smoker: No.  BP Readings from Last 3 Encounters:  06/01/19 140/90  09/30/18 (!) 128/92  09/22/18 134/82   Lab Results  Component Value Date   CREATININE 0.99 06/01/2019     Assessment/Plan: 1. Medication: no change. 2. Dietary sodium restriction. 3. Regular aerobic exercise.        Relevant Medications   gemfibrozil (LOPID) 600 MG tablet   amLODipine (NORVASC) 10 MG tablet   losartan-hydrochlorothiazide (HYZAAR) 100-25 MG tablet   metoprolol tartrate (LOPRESSOR) 50 MG tablet   Other Relevant Orders   HgB A1c   Comp Met (CMET)   Hyperlipidemia    Well controlled on current regimen. LDL is at goal on current statin dose, patient has no side effects from medication and LFTS are normal. No changes today.       Relevant Medications   gemfibrozil (LOPID) 600 MG tablet   amLODipine (NORVASC) 10 MG tablet   losartan-hydrochlorothiazide (HYZAAR) 100-25 MG tablet   metoprolol tartrate (LOPRESSOR) 50 MG tablet   Diabetes mellitus out of control (HCC)     Denies polyuria or polydipsia, no chest pain, dyspnea or TIA's, no numbness, tingling or pain in extremities. Lab Results  Component Value Date   HGBA1C 9.1 (H) 06/01/2019   HGBA1C 11.2 (H) 10/13/2018   HGBA1C 14.1 06/30/2018   Lab Results  Component Value Date   MICROALBUR 0.8 10/13/2018   LDLCALC 131 (H) 06/01/2019   CREATININE 0.99 06/01/2019   He currently takes Glyburide 1.93m13md, Levemir 85u qhs, and Metformin ER 750mg80mm and is compliant. A PA was completed for his Levemir on 2.3.21.  Insurance will not cover it but will cover Toujeo max solostar- advised to d/c lantus and start toujeo (should be close to a 1:1 conversion but advised to check FSBS several times per day for the next few weeks.  Health Maintenance 1.  Patient is counseled on appropriate foot care. 2.  BP goal < 130/80. 3.  LDL goal of < 100, HDL  > 40 and TG < 150. All diabetic should be on a statin unless contraindication. 4.  Eye Exam yearly and Dental Exam every 6 months. 5.  Dietary recommendations: < 100 g carbohydrates daily. 6.  Physical Activity recommendations: As tolerated aerobic and strength exercises.  7.  Appropriate vaccines reviewed.         Relevant Medications   losartan-hydrochlorothiazide (HYZAAR) 100-25 MG tablet   metFORMIN (GLUCOPHAGE-XR) 750 MG 24 hr tablet  glyBURIDE (DIABETA) 1.25 MG tablet   Insulin Glargine, 2 Unit Dial, (TOUJEO MAX SOLOSTAR) 300 UNIT/ML SOPN   Other Relevant Orders   HgB A1c   Comp Met (CMET)   Hypoglycemia associated with type 2 diabetes mellitus (HCC) - Primary   Relevant Medications   losartan-hydrochlorothiazide (HYZAAR) 100-25 MG tablet   metFORMIN (GLUCOPHAGE-XR) 750 MG 24 hr tablet   glyBURIDE (DIABETA) 1.25 MG tablet   Insulin Glargine, 2 Unit Dial, (TOUJEO MAX SOLOSTAR) 300 UNIT/ML SOPN   Other Relevant Orders   HgB A1c   Comp Met (CMET)      I have discontinued Saralyn Pilar L. Portier's pantoprazole. I am also having him start on [employer]. Additionally, I am having him maintain his albuterol, insulin detemir, gemfibrozil, amLODipine, losartan-hydrochlorothiazide, metFORMIN, metoCLOPramide, metoprolol tartrate, and glyBURIDE.  Meds ordered this encounter  Medications  . gemfibrozil (LOPID) 600 MG tablet    Sig: Take 1 tablet (600 mg total) by mouth 2 (two) times daily.    Dispense:  180 tablet    Refill:  1  . amLODipine (NORVASC) 10 MG tablet    Sig: Take 1 tablet (10 mg total) by mouth daily.    Dispense:  90 tablet    Refill:  1  . losartan-hydrochlorothiazide (HYZAAR) 100-25 MG tablet    Sig: Take 1 tablet by mouth daily.    Dispense:  90 tablet    Refill:  1  . metFORMIN (GLUCOPHAGE-XR) 750 MG 24 hr tablet    Sig: TAKE 1 TABLET(750 MG) BY MOUTH DAILY WITH BREAKFAST    Dispense:  90 tablet    Refill:  1  . metoCLOPramide (REGLAN) 5 MG tablet     Sig: Take 1 tablet (5 mg total) by mouth 3 (three) times daily before meals.    Dispense:  90 tablet    Refill:  3  . metoprolol tartrate (LOPRESSOR) 50 MG tablet    Sig: TAKE 1 TABLET BY MOUTH TWICE DAILY    Dispense:  180 tablet    Refill:  1  . glyBURIDE (DIABETA) 1.25 MG tablet    Sig: Take 1 tablet (1.25 mg total) by mouth every morning.    Dispense:  90 tablet    Refill:  1  . Insulin Glargine, 2 Unit Dial, (TOUJEO MAX SOLOSTAR) 300 UNIT/ML SOPN    Sig: Inject 85 Units into the skin at bedtime.    Dispense:  3 pen    Refill:  3     Arnette Norris, MD

## 2019-08-19 NOTE — Assessment & Plan Note (Addendum)
  Denies polyuria or polydipsia, no chest pain, dyspnea or TIA's, no numbness, tingling or pain in extremities. Lab Results  Component Value Date   HGBA1C 9.1 (H) 06/01/2019   HGBA1C 11.2 (H) 10/13/2018   HGBA1C 14.1 06/30/2018   Lab Results  Component Value Date   MICROALBUR 0.8 10/13/2018   LDLCALC 131 (H) 06/01/2019   CREATININE 0.99 06/01/2019   He currently takes Glyburide 1.24mg  1qd, Levemir 85u qhs, and Metformin ER 750mg  1qam and is compliant. A PA was completed for his Levemir on 2.3.21.  Insurance will not cover it but will cover Toujeo max solostar- advised to d/c lantus and start toujeo (should be close to a 1:1 conversion but advised to check FSBS several times per day for the next few weeks.  Health Maintenance 1.  Patient is counseled on appropriate foot care. 2.  BP goal < 130/80. 3.  LDL goal of < 100, HDL > 40 and TG < 150. All diabetic should be on a statin unless contraindication. 4.  Eye Exam yearly and Dental Exam every 6 months. 5.  Dietary recommendations: < 100 g carbohydrates daily. 6.  Physical Activity recommendations: As tolerated aerobic and strength exercises.  7.  Appropriate vaccines reviewed.

## 2019-08-20 ENCOUNTER — Telehealth (INDEPENDENT_AMBULATORY_CARE_PROVIDER_SITE_OTHER): Payer: 59 | Admitting: Family Medicine

## 2019-08-20 ENCOUNTER — Telehealth: Payer: Self-pay

## 2019-08-20 ENCOUNTER — Encounter: Payer: Self-pay | Admitting: Family Medicine

## 2019-08-20 VITALS — Temp 98.6°F

## 2019-08-20 DIAGNOSIS — E11649 Type 2 diabetes mellitus with hypoglycemia without coma: Secondary | ICD-10-CM | POA: Diagnosis not present

## 2019-08-20 DIAGNOSIS — E785 Hyperlipidemia, unspecified: Secondary | ICD-10-CM

## 2019-08-20 DIAGNOSIS — I10 Essential (primary) hypertension: Secondary | ICD-10-CM | POA: Diagnosis not present

## 2019-08-20 DIAGNOSIS — E1165 Type 2 diabetes mellitus with hyperglycemia: Secondary | ICD-10-CM

## 2019-08-20 MED ORDER — GLYBURIDE 1.25 MG PO TABS: 1.2500 mg | ORAL_TABLET | Freq: Every morning | ORAL | 1 refills | Status: DC

## 2019-08-20 MED ORDER — METFORMIN HCL ER 750 MG PO TB24: ORAL_TABLET | ORAL | 1 refills | Status: DC

## 2019-08-20 MED ORDER — METOCLOPRAMIDE HCL 5 MG PO TABS: 5.0000 mg | ORAL_TABLET | Freq: Three times a day (TID) | ORAL | 3 refills | Status: DC

## 2019-08-20 MED ORDER — METOPROLOL TARTRATE 50 MG PO TABS: ORAL_TABLET | ORAL | 1 refills | Status: DC

## 2019-08-20 MED ORDER — TOUJEO MAX SOLOSTAR 300 UNIT/ML ~~LOC~~ SOPN: 85.0000 [IU] | PEN_INJECTOR | Freq: Every day | SUBCUTANEOUS | 3 refills | Status: DC

## 2019-08-20 MED ORDER — AMLODIPINE BESYLATE 10 MG PO TABS: 10.0000 mg | ORAL_TABLET | Freq: Every day | ORAL | 1 refills | Status: DC

## 2019-08-20 MED ORDER — GEMFIBROZIL 600 MG PO TABS: 600.0000 mg | ORAL_TABLET | Freq: Two times a day (BID) | ORAL | 1 refills | Status: DC

## 2019-08-20 MED ORDER — LOSARTAN POTASSIUM-HCTZ 100-25 MG PO TABS: 1.0000 | ORAL_TABLET | Freq: Every day | ORAL | 1 refills | Status: DC

## 2019-08-20 NOTE — Telephone Encounter (Signed)
TA-PA for Levemir denies as not on the formulary/all covered are listed below:  Humalog Kwikpen - Tier 2 Humalog Mix 50/50 Kwikpen - Tier 2  Humalog Mix 50/50 Vial - Tier 1 Humalog Miz 75/25 Kwikpen - Tier 2 Humalog Mix 75/25 Vial - Tier 1 Humalog Subcutaneous Solution - Tier 1 Humalog Subcutaneous Solution Cartridge - Tier 2 Humulin 70/30 Kwikpen - Tier 2 Humulin 70/30 Vial - Tier 1 Humulin N Kwikpen - Tier 2 Humulin N Vial - Tier 1 Humulin R U-500 Kwikpen - Tier 2 Humulin R U-500 Vial (concentrated) - Tier 1 Humulin R Vial - Tier 1 Toujeo Max Solostar - Tier 2 Toujeo Solostar - Tier 2  Plz advise/thx dmf

## 2019-08-20 NOTE — Assessment & Plan Note (Signed)
Well controlled on current regimen. LDL is at goal on current statin dose, patient has no side effects from medication and LFTS are normal. No changes today.  

## 2019-11-05 ENCOUNTER — Encounter: Payer: 59 | Admitting: Family Medicine

## 2019-11-30 ENCOUNTER — Encounter: Payer: Self-pay | Admitting: Family Medicine

## 2019-11-30 ENCOUNTER — Ambulatory Visit: Payer: 59 | Admitting: Family Medicine

## 2019-11-30 ENCOUNTER — Other Ambulatory Visit: Payer: Self-pay

## 2019-11-30 VITALS — BP 134/86 | HR 70 | Temp 97.7°F | Resp 18 | Ht 71.5 in | Wt 256.5 lb

## 2019-11-30 DIAGNOSIS — I10 Essential (primary) hypertension: Secondary | ICD-10-CM | POA: Diagnosis not present

## 2019-11-30 DIAGNOSIS — E1149 Type 2 diabetes mellitus with other diabetic neurological complication: Secondary | ICD-10-CM | POA: Diagnosis not present

## 2019-11-30 DIAGNOSIS — M7661 Achilles tendinitis, right leg: Secondary | ICD-10-CM | POA: Diagnosis not present

## 2019-11-30 DIAGNOSIS — E1165 Type 2 diabetes mellitus with hyperglycemia: Secondary | ICD-10-CM

## 2019-11-30 LAB — POCT GLYCOSYLATED HEMOGLOBIN (HGB A1C): Hemoglobin A1C: 6.8 % — AB (ref 4.0–5.6)

## 2019-11-30 MED ORDER — METOPROLOL TARTRATE 50 MG PO TABS: ORAL_TABLET | ORAL | 1 refills | Status: DC

## 2019-11-30 MED ORDER — GLYBURIDE 1.25 MG PO TABS: 1.2500 mg | ORAL_TABLET | Freq: Every morning | ORAL | 1 refills | Status: DC

## 2019-11-30 MED ORDER — GEMFIBROZIL 600 MG PO TABS: 600.0000 mg | ORAL_TABLET | Freq: Two times a day (BID) | ORAL | 1 refills | Status: DC

## 2019-11-30 MED ORDER — TOUJEO MAX SOLOSTAR 300 UNIT/ML ~~LOC~~ SOPN: 85.0000 [IU] | PEN_INJECTOR | Freq: Every day | SUBCUTANEOUS | 3 refills | Status: DC

## 2019-11-30 MED ORDER — METFORMIN HCL ER 750 MG PO TB24: ORAL_TABLET | ORAL | 1 refills | Status: DC

## 2019-11-30 MED ORDER — AMLODIPINE BESYLATE 10 MG PO TABS: 10.0000 mg | ORAL_TABLET | Freq: Every day | ORAL | 1 refills | Status: DC

## 2019-11-30 MED ORDER — LOSARTAN POTASSIUM-HCTZ 100-25 MG PO TABS: 1.0000 | ORAL_TABLET | Freq: Every day | ORAL | 1 refills | Status: DC

## 2019-11-30 NOTE — Progress Notes (Signed)
Subjective:     Travis Scott is a 39 y.o. male presenting for Transfer of Care (from Dr Dayton Martes), Diabetes (follow up), and Foot Problem (pain in the back of right foot. off and on happens. last episode started on 11/28/19)     HPI   #HTN - taking medications  - no side effects  #Diabetes Currently taking metformin, glyburide, gemfibrozil, Toujeo  Using medications without difficulties: Yes Hypoglycemic episodes:Yes  - on days where he is extremely active and not eating Hyperglycemic episodes:No  Feet problems:foot pain today, no numbness Blood Sugars averaging: 67-160 Last HgbA1c:  Lab Results  Component Value Date   HGBA1C 6.8 (A) 11/30/2019   Went on vacation for a few weeks Has been trying to watch what he eats  Diabetes Health Maintenance Due:    Diabetes Health Maintenance Due  Topic Date Due  . OPHTHALMOLOGY EXAM  08/29/2019  . FOOT EXAM  05/31/2020  . HEMOGLOBIN A1C  06/01/2020     #right heel pain - on the ankle - hard to bear weight on it - walking with a limp - no hx of injury - woke up Saturday - sitting at rest - no pain - worse with turning the foot a certain way - takes excedrin at night - flare ups last up to 1 week -  Review of Systems   Social History   Tobacco Use  Smoking Status Current Some Day Smoker  . Types: Cigars  Smokeless Tobacco Never Used  Tobacco Comment   maybe 2 times in 6 months period        Objective:    BP Readings from Last 3 Encounters:  11/30/19 134/86  06/01/19 140/90  09/30/18 (!) 128/92   Wt Readings from Last 3 Encounters:  11/30/19 256 lb 8 oz (116.3 kg)  06/01/19 246 lb 12.8 oz (111.9 kg)  10/23/18 246 lb (111.6 kg)    BP 134/86   Pulse 70   Temp 97.7 F (36.5 C)   Resp 18   Ht 5' 11.5" (1.816 m)   Wt 256 lb 8 oz (116.3 kg)   SpO2 97%   BMI 35.28 kg/m    Physical Exam Constitutional:      Appearance: Normal appearance. He is not ill-appearing or diaphoretic.  HENT:     Right  Ear: External ear normal.     Left Ear: External ear normal.     Nose: Nose normal.  Eyes:     General: No scleral icterus.    Extraocular Movements: Extraocular movements intact.     Conjunctiva/sclera: Conjunctivae normal.  Cardiovascular:     Rate and Rhythm: Normal rate and regular rhythm.     Heart sounds: No murmur.  Pulmonary:     Effort: Pulmonary effort is normal. No respiratory distress.     Breath sounds: Normal breath sounds. No wheezing.  Musculoskeletal:     Cervical back: Neck supple.     Comments: Right foot Inspection: no swelling, erythema Palpation: TTP at the achilles insertion ROM: normal w/o pain Strength: normal Ligaments normal  Skin:    General: Skin is warm and dry.  Neurological:     Mental Status: He is alert. Mental status is at baseline.  Psychiatric:        Mood and Affect: Mood normal.           Assessment & Plan:   Problem List Items Addressed This Visit      Cardiovascular and Mediastinum   Hypertension  Controled. Cont medications. Labs in November      Relevant Medications   amLODipine (NORVASC) 10 MG tablet   gemfibrozil (LOPID) 600 MG tablet   losartan-hydrochlorothiazide (HYZAAR) 100-25 MG tablet   metoprolol tartrate (LOPRESSOR) 50 MG tablet     Endocrine   Type 2 diabetes mellitus with neurological complications ([place]) - Primary    Lab Results  Component Value Date   HGBA1C 6.8 (A) 11/30/2019   Great job getting diabetes under control. He noted some lows, so discussed monitoring these and to call if becoming more frequent. Continue current medications.       Relevant Medications   gemfibrozil (LOPID) 600 MG tablet   glyBURIDE (DIABETA) 1.25 MG tablet   insulin glargine, 2 Unit Dial, (TOUJEO MAX SOLOSTAR) 300 UNIT/ML Solostar Pen   losartan-hydrochlorothiazide (HYZAAR) 100-25 MG tablet   metFORMIN (GLUCOPHAGE-XR) 750 MG 24 hr tablet     Musculoskeletal and Integument   Achilles tendinitis of right lower  extremity    Hand out for eccentric exercises provided. Return if not improving.           Return in about 6 months (around 06/01/2020).  Lesleigh Noe, MD

## 2019-11-30 NOTE — Assessment & Plan Note (Signed)
Lab Results  Component Value Date   HGBA1C 6.8 (A) 11/30/2019   Great job getting diabetes under control. He noted some lows, so discussed monitoring these and to call if becoming more frequent. Continue current medications.

## 2019-11-30 NOTE — Patient Instructions (Addendum)
Controlling diabetes is important for improving your overall health.   Lab Results  Component Value Date   HGBA1C 6.8 (A) 11/30/2019    The ideal Hemoglobin A1c is <7%  Continue taking your medications Let me know if you start noticing more low blood sugars.   Return in about 6 months    Achilles Tendinitis Rehab  Eccentric heel drop  In this exercise, you stand and slowly raise your heel and then slowly lower it. This exercise lengthens the calf muscles (eccentric) while the heel bears weight. If this exercise is too easy, try doing it while wearing a backpack with weights in it. 1. Stand on a step with the balls of your feet. The ball of your foot is on the walking surface, right under your toes. ? Do not put your heels on the step. ? For balance, rest your hands on the wall or on a railing. 2. Rise up onto the balls of your feet. 3. Keeping your heels up, shift all of your weight to your left / right leg and pick up your other leg. 4. Slowly lower your left / right leg so your heel drops below the level of the step. 5. Put down your other foot before returning to the start position. If told by your health care provider, build up to: ? 3 sets of 15 repetitions while keeping your knees straight. ? 3 sets of 15 repetitions while keeping your knees slightly bent as far as told by your health care provider.

## 2019-11-30 NOTE — Assessment & Plan Note (Signed)
Controled. Cont medications. Labs in November

## 2019-11-30 NOTE — Assessment & Plan Note (Signed)
Hand out for eccentric exercises provided. Return if not improving.

## 2020-08-16 ENCOUNTER — Other Ambulatory Visit: Payer: Self-pay

## 2020-08-16 DIAGNOSIS — E1165 Type 2 diabetes mellitus with hyperglycemia: Secondary | ICD-10-CM

## 2020-08-16 MED ORDER — GLYBURIDE 1.25 MG PO TABS: 1.2500 mg | ORAL_TABLET | Freq: Every morning | ORAL | 0 refills | Status: DC

## 2020-09-19 ENCOUNTER — Encounter: Payer: 59 | Admitting: Family Medicine

## 2020-09-29 ENCOUNTER — Telehealth: Payer: Self-pay

## 2020-09-29 DIAGNOSIS — E1165 Type 2 diabetes mellitus with hyperglycemia: Secondary | ICD-10-CM

## 2020-09-29 DIAGNOSIS — I10 Essential (primary) hypertension: Secondary | ICD-10-CM

## 2020-09-29 MED ORDER — METFORMIN HCL ER 750 MG PO TB24: ORAL_TABLET | ORAL | 0 refills | Status: DC

## 2020-09-29 MED ORDER — LOSARTAN POTASSIUM-HCTZ 100-25 MG PO TABS: 1.0000 | ORAL_TABLET | Freq: Every day | ORAL | 0 refills | Status: DC

## 2020-09-29 NOTE — Telephone Encounter (Signed)
Pharmacy requests refill on: Metformin ER 750 mg   LAST REFILL: 11/30/2019 (Q-90, R-1) LAST OV: 11/30/2019 NEXT OV: 10/12/2020 PHARMACY: [facility] [place], [place] Hgb A1C (11/30/2019): 6.8

## 2020-09-29 NOTE — Telephone Encounter (Signed)
Pharmacy requests refill on: Losartan-Hydrochlorothiazide 100 25 mg   LAST REFILL: 11/30/2019 (Q-90, R-1) LAST OV: 11/30/2019 NEXT OV: 10/12/2020 PHARMACY: [facility] [address] [place], [place]

## 2020-09-29 NOTE — Addendum Note (Signed)
Addended by: Karenann Cai on: 09/29/2020 01:29 PM   Modules accepted: Orders

## 2020-09-30 LAB — HM DIABETES EYE EXAM

## 2020-10-12 ENCOUNTER — Encounter: Payer: 59 | Admitting: Family Medicine

## 2020-10-12 ENCOUNTER — Other Ambulatory Visit: Payer: Self-pay

## 2020-10-12 DIAGNOSIS — I10 Essential (primary) hypertension: Secondary | ICD-10-CM

## 2020-10-12 MED ORDER — AMLODIPINE BESYLATE 10 MG PO TABS: 10.0000 mg | ORAL_TABLET | Freq: Every day | ORAL | 0 refills | Status: DC

## 2020-10-12 NOTE — Telephone Encounter (Signed)
Patient needs follow-up visit for further refills. 90 day supply sent in.

## 2020-10-19 ENCOUNTER — Encounter: Payer: 59 | Admitting: Family Medicine

## 2020-11-23 ENCOUNTER — Encounter: Payer: Self-pay | Admitting: Family Medicine

## 2020-11-23 ENCOUNTER — Other Ambulatory Visit: Payer: Self-pay

## 2020-11-23 ENCOUNTER — Ambulatory Visit (INDEPENDENT_AMBULATORY_CARE_PROVIDER_SITE_OTHER): Payer: 59 | Admitting: Family Medicine

## 2020-11-23 VITALS — BP 126/72 | HR 67 | Temp 98.0°F | Ht 72.0 in | Wt 247.0 lb

## 2020-11-23 DIAGNOSIS — Z0000 Encounter for general adult medical examination without abnormal findings: Secondary | ICD-10-CM | POA: Diagnosis not present

## 2020-11-23 DIAGNOSIS — E785 Hyperlipidemia, unspecified: Secondary | ICD-10-CM

## 2020-11-23 DIAGNOSIS — E1169 Type 2 diabetes mellitus with other specified complication: Secondary | ICD-10-CM

## 2020-11-23 DIAGNOSIS — I10 Essential (primary) hypertension: Secondary | ICD-10-CM

## 2020-11-23 DIAGNOSIS — E1149 Type 2 diabetes mellitus with other diabetic neurological complication: Secondary | ICD-10-CM

## 2020-11-23 DIAGNOSIS — E1165 Type 2 diabetes mellitus with hyperglycemia: Secondary | ICD-10-CM

## 2020-11-23 DIAGNOSIS — Z1159 Encounter for screening for other viral diseases: Secondary | ICD-10-CM

## 2020-11-23 DIAGNOSIS — K900 Celiac disease: Secondary | ICD-10-CM

## 2020-11-23 LAB — COMPREHENSIVE METABOLIC PANEL
ALT: 23 U/L (ref 0–53)
AST: 22 U/L (ref 0–37)
Albumin: 4.2 g/dL (ref 3.5–5.2)
Alkaline Phosphatase: 59 U/L (ref 39–117)
BUN: 12 mg/dL (ref 6–23)
CO2: 30 mEq/L (ref 19–32)
Calcium: 9.4 mg/dL (ref 8.4–10.5)
Chloride: 103 mEq/L (ref 96–112)
Creatinine, Ser: 0.82 mg/dL (ref 0.40–1.50)
GFR: 108.75 mL/min (ref >60.00)
Glucose, Bld: 100 mg/dL — ABNORMAL HIGH (ref 70–99)
Potassium: 3.7 mEq/L (ref 3.5–5.1)
Sodium: 138 mEq/L (ref 135–145)
Total Bilirubin: 0.7 mg/dL (ref 0.2–1.2)
Total Protein: 7.3 g/dL (ref 6.0–8.3)

## 2020-11-23 LAB — LIPID PANEL
Cholesterol: 193 mg/dL (ref 0–200)
HDL: 54.5 mg/dL (ref >39.00)
LDL Cholesterol: 124 mg/dL — ABNORMAL HIGH (ref 0–99)
NonHDL: 138.31
Total CHOL/HDL Ratio: 4
Triglycerides: 71 mg/dL (ref 0.0–149.0)
VLDL: 14.2 mg/dL (ref 0.0–40.0)

## 2020-11-23 LAB — HEMOGLOBIN A1C: Hgb A1c MFr Bld: 7.8 % — ABNORMAL HIGH (ref 4.6–6.5)

## 2020-11-23 MED ORDER — METFORMIN HCL ER 750 MG PO TB24: ORAL_TABLET | ORAL | 3 refills | Status: DC

## 2020-11-23 MED ORDER — GLYBURIDE 1.25 MG PO TABS: 1.2500 mg | ORAL_TABLET | Freq: Every morning | ORAL | 3 refills | Status: DC

## 2020-11-23 MED ORDER — TOUJEO MAX SOLOSTAR 300 UNIT/ML ~~LOC~~ SOPN: 85.0000 [IU] | PEN_INJECTOR | Freq: Every day | SUBCUTANEOUS | 3 refills | Status: DC

## 2020-11-23 MED ORDER — BLOOD GLUCOSE MONITOR KIT: PACK | 0 refills | Status: DC

## 2020-11-23 MED ORDER — ATORVASTATIN CALCIUM 10 MG PO TABS: 10.0000 mg | ORAL_TABLET | Freq: Every day | ORAL | 3 refills | Status: DC

## 2020-11-23 MED ORDER — GEMFIBROZIL 600 MG PO TABS: 600.0000 mg | ORAL_TABLET | Freq: Two times a day (BID) | ORAL | 3 refills | Status: DC

## 2020-11-23 NOTE — Progress Notes (Signed)
Annual Exam   Chief Complaint:  Chief Complaint  Patient presents with  . Annual Exam    History of Present Illness:  Travis Scott is a 40 y.o. presents today for annual examination.     Nutrition/Lifestyle Diet: diabetic diet - reducing meat in diet - chicken and fish Exercise: walking and weight He is single partner, contraception - none.  Any issues getting or maintaining an erection? no  Social History   Tobacco Use  Smoking Status Current Some Day Smoker  . Types: Cigars  Smokeless Tobacco Never Used  Tobacco Comment   maybe 2 times in 6 months period   Social History   Substance and Sexual Activity  Alcohol Use Not Currently   Comment: once every few months   Social History   Substance and Sexual Activity  Drug Use No     Safety The patient wears seatbelts: yes.     The patient feels safe at home and in their relationships: yes.  General Health Dentist in the last year: Yes Eye doctor: yes  Weight Wt Readings from Last 3 Encounters:  11/23/20 247 lb (112 kg)  11/30/19 256 lb 8 oz (116.3 kg)  06/01/19 246 lb 12.8 oz (111.9 kg)   Patient has high BMI  BMI Readings from Last 1 Encounters:  11/23/20 33.50 kg/m     Chronic disease screening Blood pressure monitoring:  BP Readings from Last 3 Encounters:  11/23/20 126/72  11/30/19 134/86  06/01/19 140/90    Lipid Monitoring: Indication for screening: age >35, obesity, diabetes, family hx, CV risk factors.  Lipid screening: Yes  Lab Results  Component Value Date   CHOL 197 06/01/2019   HDL 50.10 06/01/2019   LDLCALC 131 (H) 06/01/2019   LDLDIRECT 191.3 04/10/2011   TRIG 78.0 06/01/2019   CHOLHDL 4 06/01/2019     Diabetes Screening: age >96, overweight, family hx, PCOS, hx of gestational diabetes, at risk ethnicity, elevated blood pressure >135/80.  Diabetes Screening screening: Yes  Lab Results  Component Value Date   HGBA1C 6.8 (A) 11/30/2019     Immunization History   Administered Date(s) Administered  . Influenza,inj,Quad PF,6+ Mos 04/30/2016, 05/23/2017, 06/01/2019  . PFIZER(Purple Top)SARS-COV-2 Vaccination 09/24/2019, 10/22/2019  . Pneumococcal Conjugate-13 12/28/2014  . Tdap 06/30/2018    Past Medical History:  Diagnosis Date  . Chronic back pain   . Diabetes mellitus   . Hypertension   . Kidney stones     Past Surgical History:  Procedure Laterality Date  . ear tubes with butterfly clip put in Right    for fluid drainage    Prior to Admission medications   Medication Sig Start Date End Date Taking? Authorizing Provider  amLODipine (NORVASC) 10 MG tablet Take 1 tablet (10 mg total) by mouth daily. Appt with PCP needed for further refills. 10/12/20  Yes Lesleigh Noe, MD  gemfibrozil (LOPID) 600 MG tablet Take 1 tablet (600 mg total) by mouth 2 (two) times daily. 11/30/19  Yes Lesleigh Noe, MD  glyBURIDE (DIABETA) 1.25 MG tablet Take 1 tablet (1.25 mg total) by mouth every morning. 08/16/20  Yes Lesleigh Noe, MD  insulin glargine, 2 Unit Dial, (TOUJEO MAX SOLOSTAR) 300 UNIT/ML Solostar Pen Inject 85 Units into the skin at bedtime. 11/30/19  Yes Lesleigh Noe, MD  losartan-hydrochlorothiazide (HYZAAR) 100-25 MG tablet Take 1 tablet by mouth daily. 09/29/20  Yes Lesleigh Noe, MD  metFORMIN (GLUCOPHAGE-XR) 750 MG 24 hr tablet TAKE 1 TABLET(750 MG) BY  MOUTH DAILY WITH BREAKFAST 09/29/20  Yes Lesleigh Noe, MD  metoprolol tartrate (LOPRESSOR) 50 MG tablet TAKE 1 TABLET BY MOUTH TWICE DAILY 11/30/19  Yes Lesleigh Noe, MD    No Known Allergies   Social History   Socioeconomic History  . Marital status: Married    Spouse name: Sinquetta  . Number of children: 3  . Years of education: masters degree  . Highest education level: Not on file  Occupational History    Employer: [employer]  Tobacco Use  . Smoking status: Current Some Day Smoker    Types: Cigars  . Smokeless tobacco: Never Used  . Tobacco comment: maybe 2 times in  6 months period  Vaping Use  . Vaping Use: Never used  Substance and Sexual Activity  . Alcohol use: Not Currently    Comment: once every few months  . Drug use: No  . Sexual activity: Yes    Birth control/protection: None  Other Topics Concern  . 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 [employer]      Family: 3 children [name] (2008), Oswaldo Milian (2010), Lysbeth Galas (2015)       Enjoys: get outside, travel, race track, beach/mountains, listen to music, coloring books      Exercise: walking - 3 times a week 3-5 miles    Diet: trying to follow diabetic diet      Safety   Seat belts: Yes    Guns: Yes  and secure   Safe in relationships: Yes    Social Determinants of Health   Financial Resource Strain: Not on file  Food Insecurity: Not on file  Transportation Needs: Not on file  Physical Activity: Not on file  Stress: Not on file  Social Connections: Not on file  Intimate Partner Violence: Not on file    Family History  Problem Relation Age of Onset  . Hypertension Mother   . Diabetes Mother   . Diabetes Father   . Hypertension Father   . Gout Father   . Healthy Sister   . Breast cancer Maternal Grandmother   . Diabetes Maternal Grandfather   . Hypertension Maternal Grandfather   . Cancer Paternal Grandmother        not sure of the type  . Stroke Paternal Grandmother 34    Review of Systems  Constitutional: Negative for chills and fever.  HENT: Negative for congestion and sore throat.   Eyes: Negative for blurred vision and double vision.  Respiratory: Negative for shortness of breath.   Cardiovascular: Negative for chest pain.  Gastrointestinal: Negative for heartburn, nausea and vomiting.  Genitourinary: Negative.   Musculoskeletal: Negative.  Negative for myalgias.  Skin: Negative for rash.   Neurological: Negative for dizziness and headaches.  Endo/Heme/Allergies: Does not bruise/bleed easily.  Psychiatric/Behavioral: Negative for depression. The patient is not nervous/anxious.      Physical Exam BP 126/72   Pulse 67   Temp 98 F (36.7 C) (Temporal)   Ht 6' (1.829 m)   Wt 247 lb (112 kg)   SpO2 98%   BMI 33.50 kg/m    BP Readings from Last 3 Encounters:  11/23/20 126/72  11/30/19 134/86  06/01/19 140/90      Physical Exam Constitutional:      General: He is not in acute distress.  Appearance: He is well-developed. He is not diaphoretic.  HENT:     Head: Normocephalic and atraumatic.     Right Ear: Tympanic membrane and ear canal normal.     Left Ear: Tympanic membrane and ear canal normal.     Nose: Nose normal.     Mouth/Throat:     Pharynx: Uvula midline.  Eyes:     General: No scleral icterus.    Conjunctiva/sclera: Conjunctivae normal.     Pupils: Pupils are equal, round, and reactive to light.  Cardiovascular:     Rate and Rhythm: Normal rate and regular rhythm.     Heart sounds: Normal heart sounds. No murmur heard.   Pulmonary:     Effort: Pulmonary effort is normal. No respiratory distress.     Breath sounds: Normal breath sounds. No wheezing.  Abdominal:     General: Bowel sounds are normal. There is no distension.     Palpations: Abdomen is soft. There is no mass.     Tenderness: There is no abdominal tenderness. There is no guarding.  Musculoskeletal:        General: Normal range of motion.     Cervical back: Normal range of motion and neck supple.  Lymphadenopathy:     Cervical: No cervical adenopathy.  Skin:    General: Skin is warm and dry.     Capillary Refill: Capillary refill takes less than 2 seconds.  Neurological:     Mental Status: He is alert and oriented to person, place, and time.        Results:  PHQ-9:   Depression screen [facility] 2/9 11/23/2020 11/30/2019 10/23/2018  Decreased Interest 0 0 0  Down, Depressed,  Hopeless 0 0 0  PHQ - 2 Score 0 0 0       Assessment: 40 y.o. here for routine annual physical examination.  Plan: Problem List Items Addressed This Visit      Cardiovascular and Mediastinum   Hypertension   Relevant Medications   atorvastatin (LIPITOR) 10 MG tablet   Other Relevant Orders   Comprehensive metabolic panel     Endocrine   Type 2 diabetes mellitus with neurological complications (HCC)   Relevant Medications   glyBURIDE (DIABETA) 1.25 MG tablet   insulin glargine, 2 Unit Dial, (TOUJEO MAX SOLOSTAR) 300 UNIT/ML Solostar Pen   blood glucose meter kit and supplies KIT   metFORMIN (GLUCOPHAGE-XR) 750 MG 24 hr tablet   atorvastatin (LIPITOR) 10 MG tablet   Other Relevant Orders   Lipid panel   Hemoglobin A1c     Other   Hyperlipidemia    Not currently taking statin but gemfibrozil. Discussed switching to atorvastatin due to heart/stroke benefit. He will switch. Lipids today and recheck in 3-6 months      Relevant Medications   atorvastatin (LIPITOR) 10 MG tablet    Other Visit Diagnoses    Annual physical exam    -  Primary   Uncontrolled type 2 diabetes mellitus with hyperglycemia (HCC)       Relevant Medications   glyBURIDE (DIABETA) 1.25 MG tablet   insulin glargine, 2 Unit Dial, (TOUJEO MAX SOLOSTAR) 300 UNIT/ML Solostar Pen   blood glucose meter kit and supplies KIT   metFORMIN (GLUCOPHAGE-XR) 750 MG 24 hr tablet   atorvastatin (LIPITOR) 10 MG tablet   Need for hepatitis C screening test       Relevant Orders   Hepatitis C antibody   Transient gluten sensitivity       Relevant  Orders   Celiac Pnl 2 rflx Endomysial Ab Ttr   Hyperlipidemia associated with type 2 diabetes mellitus (HCC)       Relevant Medications   glyBURIDE (DIABETA) 1.25 MG tablet   insulin glargine, 2 Unit Dial, (TOUJEO MAX SOLOSTAR) 300 UNIT/ML Solostar Pen   metFORMIN (GLUCOPHAGE-XR) 750 MG 24 hr tablet   atorvastatin (LIPITOR) 10 MG tablet      Screening: -- Blood  pressure screen normal -- cholesterol screening: will obtain -- Weight screening: overweight: continue to monitor -- Diabetes Screening: has diabetes -- Nutrition: Encouraged healthy diet and exercise  The 10-year ASCVD risk score Mikey Bussing DC Jr., et al., 2013) is: 16.4%   Values used to calculate the score:     Age: 40 years     Sex: Male     Is Non-Hispanic African American: Yes     Diabetic: Yes     Tobacco smoker: Yes     Systolic Blood Pressure: 680 mmHg     Is BP treated: Yes     HDL Cholesterol: 50.1 mg/dL     Total Cholesterol: 197 mg/dL  -- Statin therapy for Age 59-75 with CVD risk >7.5%  Psych -- Depression screening (PHQ-9): low risk   Safety -- tobacco screening: rare cigars -- alcohol screening:  low-risk usage. -- no evidence of domestic violence or intimate partner violence.   Cancer Screening -- No age related cancer screening due  Immunizations Immunization History  Administered Date(s) Administered  . Influenza,inj,Quad PF,6+ Mos 04/30/2016, 05/23/2017, 06/01/2019  . PFIZER(Purple Top)SARS-COV-2 Vaccination 09/24/2019, 10/22/2019  . Pneumococcal Conjugate-13 12/28/2014  . Tdap 06/30/2018    -- flu vaccine up to date -- TDAP q10 years up to date -- PPSV-23 (19-64 with chronic disease or smoking) will give at next visit -- Covid-19 Vaccine up to date   Encouraged regular vision and dental screening. Encouraged healthy exercise and diet.   Lesleigh Noe

## 2020-11-23 NOTE — Assessment & Plan Note (Signed)
Not currently taking statin but gemfibrozil. Discussed switching to atorvastatin due to heart/stroke benefit. He will switch. Lipids today and recheck in 3-6 months

## 2020-11-23 NOTE — Patient Instructions (Signed)
Stop Gemfibrozol  Start atorvastatin

## 2020-11-24 ENCOUNTER — Encounter: Payer: Self-pay | Admitting: Family Medicine

## 2020-11-24 DIAGNOSIS — E11319 Type 2 diabetes mellitus with unspecified diabetic retinopathy without macular edema: Secondary | ICD-10-CM | POA: Insufficient documentation

## 2020-12-07 LAB — HEPATITIS C ANTIBODY
Hepatitis C Ab: NONREACTIVE
SIGNAL TO CUT-OFF: 0.01 (ref <1.00)

## 2020-12-07 LAB — CELIAC PNL 2 RFLX ENDOMYSIAL AB TTR
(tTG) Ab, IgA: <1 U/mL
(tTG) Ab, IgG: <1 U/mL
Endomysial Ab IgA: NEGATIVE
Gliadin IgA: <1 U/mL
Gliadin IgG: <1 U/mL
Immunoglobulin A: 150 mg/dL (ref 47–310)

## 2020-12-31 ENCOUNTER — Other Ambulatory Visit: Payer: Self-pay | Admitting: Family Medicine

## 2020-12-31 DIAGNOSIS — E1149 Type 2 diabetes mellitus with other diabetic neurological complication: Secondary | ICD-10-CM

## 2020-12-31 DIAGNOSIS — I10 Essential (primary) hypertension: Secondary | ICD-10-CM

## 2020-12-31 DIAGNOSIS — E1165 Type 2 diabetes mellitus with hyperglycemia: Secondary | ICD-10-CM

## 2021-01-24 ENCOUNTER — Telehealth: Payer: Self-pay

## 2021-01-24 NOTE — Telephone Encounter (Signed)
Noted agree with assessment

## 2021-01-24 NOTE — Telephone Encounter (Signed)
[facility] Night - Client TELEPHONE ADVICE RECORD AccessNurse Patient Name: Travis Scott Gender: Male DOB: 02/03/1981 Age: 40 Y 1 M 25 D Return Phone Number: [phone] (Primary), [phone] (Secondary) Address: City/ State/ Zip: [place] [address] Client [facility] Night - Client Client Site [facility] - Night Physician [name]- MD Contact Type Call Who Is Calling Patient / Member / Family / Caregiver Call Type Triage / Clinical Relationship To Patient Self Return Phone Number [phone] (Primary) Chief Complaint Numbness Reason for Call Request to Schedule Office Appointment Initial Comment Caller states he is needing to schedule an appt. He has a pinched nerve in his back/shoulder. He is feeling tingling sensation and numbness in his hand. Left knee was swollen and sore. Knee brace and ice/ heat has helped. Translation No Nurse Assessment Nurse: Kizzie Bane, RN, Marylene Land Date/Time (Eastern Time): 01/24/2021 7:36:23 AM Confirm and document reason for call. If symptomatic, describe symptoms. ---Caller states he is having shoulder pain that is causing some numbness in his left hand. Symptoms started last week Does the patient have any new or worsening symptoms? ---Yes Will a triage be completed? ---Yes Related visit to physician within the last 2 weeks? ---No Does the PT have any chronic conditions? (i.e. diabetes, asthma, this includes High risk factors for pregnancy, etc.) ---Yes List chronic conditions. ---hypertension, diabetes Is this a behavioral health or substance abuse call? ---No Guidelines Guideline Title Affirmed Question Affirmed Notes Nurse Date/Time Lamount Cohen Time) Shoulder Pain Weakness (i.e., loss of strength) in hand or fingers (Exception: not truly weak; hand feels weak because of pain) Kizzie Bane, RN, Marylene Land 01/24/2021 7:38:02 AM PLEASE NOTE: All timestamps  contained within this report are represented as [place] Standard Time. CONFIDENTIALTY NOTICE: This fax transmission is intended only for the addressee. It contains information that is legally privileged, confidential or otherwise protected from use or disclosure. If you are not the intended recipient, you are strictly prohibited from reviewing, disclosing, copying using or disseminating any of this information or taking any action in reliance on or regarding this information. If you have received this fax in error, please notify us immediately by telephone so that we can arrange for its return to [place]. Phone: [phone], Toll-Free: [phone], Fax: [phone] Page: 2 of 2 Call Id: [id number] Disp. Time Lamount Cohen Time) Disposition Final User 01/24/2021 7:43:48 AM See HCP within 4 Hours (or PCP triage) Yes Kizzie Bane, RN, Rosalyn Charters Disagree/Comply Comply Caller Understands Yes PreDisposition Did not know what to do Care Advice Given Per Guideline SEE HCP (OR PCP TRIAGE) WITHIN 4 HOURS: CALL BACK IF: * You become worse CARE ADVICE given per Shoulder Pain (Adult) guideline Referrals GO TO FACILITY UNDECIDED

## 2021-01-24 NOTE — Telephone Encounter (Signed)
Per DRP left v/m that received note from access nurse; I called pt and left v/m that got note pt having shoulder and back pain and per chart review tab appears pt is at [facility]. Included in v/m if needs further assistance to call [facility]. Sending note to Dr Selena Batten and [facility] CMA.

## 2021-05-02 ENCOUNTER — Telehealth: Payer: Self-pay

## 2021-05-02 ENCOUNTER — Telehealth (INDEPENDENT_AMBULATORY_CARE_PROVIDER_SITE_OTHER): Payer: 59 | Admitting: Nurse Practitioner

## 2021-05-02 ENCOUNTER — Other Ambulatory Visit: Payer: Self-pay | Admitting: Family Medicine

## 2021-05-02 VITALS — BP 150/96 | HR 82 | Temp 99.9°F | Wt 253.2 lb

## 2021-05-02 DIAGNOSIS — U071 COVID-19: Secondary | ICD-10-CM | POA: Insufficient documentation

## 2021-05-02 DIAGNOSIS — I10 Essential (primary) hypertension: Secondary | ICD-10-CM

## 2021-05-02 MED ORDER — MOLNUPIRAVIR EUA 200MG CAPSULE
4.0000 | ORAL_CAPSULE | Freq: Two times a day (BID) | ORAL | 0 refills | Status: AC
Start: 2021-05-02 — End: 2021-05-07

## 2021-05-02 NOTE — Progress Notes (Signed)
Patient ID: Travis Scott, male    DOB: [birth year], 40 y.o.   MRN: [id number]  Virtual visit completed through [employer], a video enabled telemedicine application. Due to national recommendations of social distancing due to COVID-19, a virtual visit is felt to be most appropriate for this patient at this time. Reviewed limitations, risks, security and privacy concerns of performing a virtual visit and the availability of in person appointments. I also reviewed that there may be a patient responsible charge related to this service. The patient agreed to proceed.   Patient location: home Provider location: [place] [facility], office Persons participating in this virtual visit: patient, provider   If any vitals were documented, they were collected by patient at home unless specified below.    BP (!) 150/96   Pulse 82   Temp 99.9 F (37.7 C)   Wt 253 lb 3 oz (114.8 kg)   BMI 34.34 kg/m    CC: Covid 19 Infection  Subjective:   HPI: Travis Scott is a 40 y.o. male presenting on 05/02/2021 for Covid Positive (On 05/02/21, sx started on 04/30/21-fever, h/a, body aches, runny nose, diarrhea. )    Symptoms on 04/30/2021 Tested today at home on 05/02/2021 Covid vaccines pfizer x2 and booster [employer] brand severe cold and flu. Helped some    Relevant past medical, surgical, family and social history reviewed and updated as indicated. Interim medical history since our last visit reviewed. Allergies and medications reviewed and updated. Outpatient Medications Prior to Visit  Medication Sig Dispense Refill   amLODipine (NORVASC) 10 MG tablet TAKE 1 TABLET BY MOUTH DAILY 90 tablet 0   atorvastatin (LIPITOR) 10 MG tablet Take 1 tablet (10 mg total) by mouth daily. 90 tablet 3   Blood Glucose Monitoring Suppl (ONETOUCH VERIO FLEX SYSTEM) w/Device KIT USE UP TO FOUR TIMES DAILY AS DIRECTED E11.9 1 kit 0   cyclobenzaprine (FLEXERIL) 10 MG tablet Take 10 mg by mouth 2 (two) times  daily as needed.     diclofenac (VOLTAREN) 50 MG EC tablet Take 50 mg by mouth 2 (two) times daily as needed.     gemfibrozil (LOPID) 600 MG tablet Take 600 mg by mouth 2 (two) times daily.     glyBURIDE (DIABETA) 1.25 MG tablet Take 1 tablet (1.25 mg total) by mouth every morning. 90 tablet 3   insulin glargine, 2 Unit Dial, (TOUJEO MAX SOLOSTAR) 300 UNIT/ML Solostar Pen Inject 86 Units into the skin at bedtime. 9 mL 3   Lancets (ONETOUCH DELICA PLUS [id number]) MISC Apply topically.     metFORMIN (GLUCOPHAGE-XR) 750 MG 24 hr tablet TAKE 1 TABLET(750 MG) BY MOUTH DAILY WITH BREAKFAST 90 tablet 3   metoprolol tartrate (LOPRESSOR) 50 MG tablet TAKE 1 TABLET BY MOUTH TWICE DAILY 180 tablet 1   ONETOUCH VERIO test strip USE UP TO FOUR TIMES DAILY AS DIRECTED 100 strip 3   losartan-hydrochlorothiazide (HYZAAR) 100-25 MG tablet TAKE 1 TABLET BY MOUTH DAILY 90 tablet 0   No facility-administered medications prior to visit.     Per HPI unless specifically indicated in ROS section below Review of Systems  Constitutional:  Positive for chills, fatigue and fever.  HENT:  Positive for congestion. Negative for sore throat.   Respiratory:  Positive for cough (yellowish). Negative for shortness of breath.   Cardiovascular:  Negative for chest pain.  Gastrointestinal:  Positive for diarrhea. Negative for nausea and vomiting.  Musculoskeletal:  Positive for myalgias.  Neurological:  Positive  for headaches.  Objective:  BP (!) 150/96   Pulse 82   Temp 99.9 F (37.7 C)   Wt 253 lb 3 oz (114.8 kg)   BMI 34.34 kg/m   Wt Readings from Last 3 Encounters:  05/02/21 253 lb 3 oz (114.8 kg)  11/23/20 247 lb (112 kg)  11/30/19 256 lb 8 oz (116.3 kg)       Physical exam: Gen: alert, NAD, not ill appearing Pulm: speaks in complete sentences without increased work of breathing Psych: normal mood, normal thought content      Results for orders placed or performed in visit on 11/23/20  HM DIABETES EYE  EXAM  Result Value Ref Range   HM Diabetic Eye Exam No Retinopathy No Retinopathy   Assessment & Plan:   Problem List Items Addressed This Visit       Other   COVID-19 - Primary    Patient took at home COVID test was positive.  He is vaccinated against COVID with 2 initial vaccines and 1 booster.  Patient is considered high risk due to for tobacco abuse, obesity, uncontrolled diabetes.  Did discuss medications emergency use authorized only.  Did discuss common side effects with patient.  Also discussed if he is planning to have kids he does not need a father child with in the next 72 days.  Patient acknowledged and agreed to continue with medication.  We will start him on molnupiravir.  Did discuss signs and symptoms when to seek emergent or urgent health care.  Also discussed CDC guidelines in regards to quarantine.  Patient acknowledged. Start as molnupiravir soon as possible      Relevant Medications   molnupiravir EUA (LAGEVRIO) 200 mg CAPS capsule     No orders of the defined types were placed in this encounter.  No orders of the defined types were placed in this encounter.   I discussed the assessment and treatment plan with the patient. The patient was provided an opportunity to ask questions and all were answered. The patient agreed with the plan and demonstrated an understanding of the instructions. The patient was advised to call back or seek an in-person evaluation if the symptoms worsen or if the condition fails to improve as anticipated.  Follow up plan: No follow-ups on file.  Romilda Garret, NP

## 2021-05-02 NOTE — Telephone Encounter (Signed)
Appreciate Audria Nine seeing patient

## 2021-05-02 NOTE — Telephone Encounter (Addendum)
Pt called with + home covid test on 05/02/21; pts symptoms started on 04/30/21 with H/A pain level of 7-8; no H/A 05/02/21; pt has fever starting at 101.5 and then taking tylenol cold and flu and fever went down last night to 98. Pt will take temp this morning. Pt has body aches and chills starting this morning. Pt has prod cough with clear phlegm, when pt blows nose has clear mucus that is blood tinged.pt does have some sinus congestion and a dry throat but throat is not sore. Pt is not in any distress with his breathing. No CP or SOB.Pt scheduled video visit with Audria Nine NP 05/02/21 at 12 noon. Pt will have vitals ready when CMA calls. UC & ED precautions given and pt voiced understanding. Sending note to Dr Selena Batten as PCP, Audria Nine NP and Alvina Chou CMA; will also teams Anastasiya.

## 2021-05-02 NOTE — Assessment & Plan Note (Signed)
Patient took at home COVID test was positive.  He is vaccinated against COVID with 2 initial vaccines and 1 booster.  Patient is considered high risk due to for tobacco abuse, obesity, uncontrolled diabetes.  Did discuss medications emergency use authorized only.  Did discuss common side effects with patient.  Also discussed if he is planning to have kids he does not need a father child with in the next 90 days.  Patient acknowledged and agreed to continue with medication.  We will start him on molnupiravir.  Did discuss signs and symptoms when to seek emergent or urgent health care.  Also discussed CDC guidelines in regards to quarantine.  Patient acknowledged. Start as molnupiravir soon as possible

## 2021-05-29 ENCOUNTER — Ambulatory Visit: Payer: 59 | Admitting: Family Medicine

## 2021-09-08 ENCOUNTER — Other Ambulatory Visit: Payer: Self-pay | Admitting: Family Medicine

## 2021-09-08 DIAGNOSIS — I10 Essential (primary) hypertension: Secondary | ICD-10-CM

## 2021-09-27 ENCOUNTER — Other Ambulatory Visit: Payer: Self-pay

## 2021-09-27 ENCOUNTER — Ambulatory Visit: Payer: 59 | Admitting: Family Medicine

## 2021-09-27 VITALS — BP 110/72 | HR 64 | Temp 97.9°F | Ht 72.0 in | Wt 253.5 lb

## 2021-09-27 DIAGNOSIS — R519 Headache, unspecified: Secondary | ICD-10-CM

## 2021-09-27 DIAGNOSIS — E1149 Type 2 diabetes mellitus with other diabetic neurological complication: Secondary | ICD-10-CM | POA: Diagnosis not present

## 2021-09-27 DIAGNOSIS — H9201 Otalgia, right ear: Secondary | ICD-10-CM | POA: Insufficient documentation

## 2021-09-27 NOTE — Assessment & Plan Note (Signed)
He is overdue for diabetes follow-up advised follow-up in 4 weeks.  He had questions about metformin due to what he read online encouraged continuing as he noted some high blood sugars over the weekend.  Continue insulin 86 units daily. ?

## 2021-09-27 NOTE — Progress Notes (Signed)
? ?Subjective:  ? ?  ?Travis Scott is a 41 y.o. male presenting for Ear Pain (R x week and a half ) and Headache ?  ? ? ?Headache  ?Associated symptoms include ear pain and neck pain (on the right side). Pertinent negatives include no coughing, rhinorrhea, sore throat or vomiting.  ?Otalgia  ?There is pain in the right ear. This is a new problem. The current episode started 1 to 4 weeks ago. There has been no fever. Associated symptoms include headaches and neck pain (on the right side). Pertinent negatives include no coughing, diarrhea, ear discharge, rhinorrhea, sore throat or vomiting. Associated symptoms comments: Congestion 2 weeks ago. He has tried NSAIDs for the symptoms. His past medical history is significant for a tympanostomy tube (saw ENT in 2014).  ? ?Headache is on the right side ? ? ?Review of Systems  ?HENT:  Positive for ear pain. Negative for ear discharge, rhinorrhea and sore throat.   ?Respiratory:  Negative for cough.   ?Gastrointestinal:  Negative for diarrhea and vomiting.  ?Musculoskeletal:  Positive for neck pain (on the right side).  ?Neurological:  Positive for headaches.  ? ? ?Social History  ? ?Tobacco Use  ?Smoking Status Some Days  ? Types: Cigars  ?Smokeless Tobacco Never  ?Tobacco Comments  ? maybe 2 times in 6 months period  ? ? ? ?   ?Objective:  ?  ?BP Readings from Last 3 Encounters:  ?09/27/21 110/72  ?05/02/21 (!) 150/96  ?11/23/20 126/72  ? ?Wt Readings from Last 3 Encounters:  ?09/27/21 253 lb 8 oz (115 kg)  ?05/02/21 253 lb 3 oz (114.8 kg)  ?11/23/20 247 lb (112 kg)  ? ? ?BP 110/72   Pulse 64   Temp 97.9 ?F (36.6 ?C) (Oral)   Ht 6' (1.829 m)   Wt 253 lb 8 oz (115 kg)   SpO2 98%   BMI 34.38 kg/m?  ? ? ?Physical Exam ?Constitutional:   ?   Appearance: Normal appearance. He is not ill-appearing or diaphoretic.  ?HENT:  ?   Head: Normocephalic and atraumatic.  ?   Jaw: Tenderness (right TMJ) present. No pain on movement.  ?   Right Ear: External ear normal. Tympanic  membrane is scarred.  ?   Left Ear: Tympanic membrane and external ear normal.  ?   Ears:  ?   Comments: Right ear canal with PE tube present and small amount of wax obstructing the lower TM. What is visible is reflective w/o erythema or swelling or fluid.  ?   Nose: Nose normal.  ?   Mouth/Throat:  ?   Mouth: Mucous membranes are moist.  ?Eyes:  ?   General: No scleral icterus. ?   Extraocular Movements: Extraocular movements intact.  ?   Conjunctiva/sclera: Conjunctivae normal.  ?Cardiovascular:  ?   Rate and Rhythm: Normal rate and regular rhythm.  ?   Heart sounds: No murmur heard. ?Pulmonary:  ?   Effort: Pulmonary effort is normal. No respiratory distress.  ?   Breath sounds: Normal breath sounds. No wheezing.  ?Musculoskeletal:  ?   Cervical back: Neck supple.  ?Skin: ?   General: Skin is warm and dry.  ?Neurological:  ?   Mental Status: He is alert. Mental status is at baseline.  ?Psychiatric:     ?   Mood and Affect: Mood normal.  ? ? ? ? ? ?   ?Assessment & Plan:  ? ?Problem List Items Addressed This Visit   ? ?  ?  Endocrine  ? Type 2 diabetes mellitus with neurological complications ([place])  ?  He is overdue for diabetes follow-up advised follow-up in 4 weeks.  He had questions about metformin due to what he read online encouraged continuing as he noted some high blood sugars over the weekend.  Continue insulin 86 units daily. ?  ?  ?  ? Other  ? Right ear pain - Primary  ?  Suspect some eustachian tube dysfunction as no clear sign of infection on exam today.  Patient with history of this developing in the past and resulted in the need for tube placement.  Advised trial of pseudoephedrine and Flonase for 1 to 2 weeks if no improvement update. Also placed ear nose and throat referral as his tube has fallen out and he does have a history of needing tubes for similar symptoms. ?  ?  ? Relevant Orders  ? Ambulatory referral to ENT  ? Right-sided headache  ?  Suspect this may be related to his ear pain, however  does have some tenderness over the TMJ.  Handout for TMJ provided to look for possible triggers for this to his help clarify symptoms.  Anti-inflammatories as needed. ?  ?  ? ? ? ?Return in about 4 weeks (around 10/25/2021) for Diabetes. ? ?Lesleigh Noe, MD ? ?This visit occurred during the SARS-CoV-2 public health emergency.  Safety protocols were in place, including screening questions prior to the visit, additional usage of staff PPE, and extensive cleaning of exam room while observing appropriate contact time as indicated for disinfecting solutions.  ? ?

## 2021-09-27 NOTE — Assessment & Plan Note (Signed)
Suspect this may be related to his ear pain, however does have some tenderness over the TMJ.  Handout for TMJ provided to look for possible triggers for this to his help clarify symptoms.  Anti-inflammatories as needed. ?

## 2021-09-27 NOTE — Patient Instructions (Signed)
Suspected Eustachian tube dysfunction ?- Flonase daily ?- pseudoephedrine x 1-2 weeks ?- ibuprofen as needed ? ?Call or update if not improving  ?- referral to ENT ?- call if fevers, chills  ? ? ?Temporomandibular Joint Syndrome ?Temporomandibular joint syndrome (TMJ syndrome) is a condition that causes pain in the temporomandibular joints. These joints are located near your ears and allow your jaw to open and close. For people with TMJ syndrome, chewing, biting, or other movements of the jaw can be difficult or painful. ?TMJ syndrome is often mild and goes away within a few weeks. However, sometimes the condition becomes a long-term (chronic) problem. ?What are the causes? ?This condition may be caused by: ?Grinding your teeth or clenching your jaw. Some people do this when they are stressed. ?Arthritis. ?An injury to the jaw. ?A head or neck injury. ?Teeth or dentures that are not aligned well. ?In some cases, the cause of TMJ syndrome may not be known. ?What are the signs or symptoms? ?The most common symptom of this condition is aching pain on the side of the head in the area of the TMJ. Other symptoms may include: ?Pain when moving your jaw, such as when chewing or biting. ?Not being able to open your jaw all the way. ?Making a clicking sound when you open your mouth. ?Headache. ?Earache. ?Neck or shoulder pain. ?How is this diagnosed? ?This condition may be diagnosed based on: ?Your symptoms and medical history. ?A physical exam. Your health care provider may check the range of motion of your jaw. ?Imaging tests, such as X-rays or an MRI. ?You may also need to see your dentist, who will check if your teeth and jaw are lined up correctly. ?How is this treated? ?TMJ syndrome often goes away on its own. If treatment is needed, it may include: ?Eating soft foods and applying ice or heat. ?Medicines to relieve pain or inflammation. ?Medicines or massage to relax the muscles. ?A splint, bite plate, or mouthpiece to  prevent teeth grinding or jaw clenching. ?Relaxation techniques or counseling to help reduce stress. ?A therapy for pain in which an electrical current is applied to the nerves through the skin (transcutaneous electrical nerve stimulation). ?Acupuncture. This may help to relieve pain. ?Jaw surgery. This is rarely needed. ?Follow these instructions at home: ?Eating and drinking ?Eat a soft diet if you are having trouble chewing. ?Avoid foods that require a lot of chewing. Do not chew gum. ?General instructions ?Take over-the-counter and prescription medicines only as told by your health care provider. ?If directed, put ice on the painful area. To do this: ?Put ice in a plastic bag. ?Place a towel between your skin and the bag. ?Leave the ice on for 20 minutes, 2-3 times a day. ?Remove the ice if your skin turns bright red. This is very important. If you cannot feel pain, heat, or cold, you have a greater risk of damage to the area. ?Apply a warm, wet cloth (warm compress) to the painful area as told. ?Massage your jaw area and do any jaw stretching exercises as told by your health care provider. ?If you were given a splint, bite plate, or mouthpiece, wear it as told by your health care provider. ?Keep all follow-up visits. This is important. ?Where to find more information ?[employer]: [email or web address] ?Contact a health care provider if: ?You have trouble eating. ?You have new or worsening symptoms. ?Get help right away if: ?Your jaw locks. ?Summary ?Temporomandibular joint syndrome (TMJ syndrome)  is a condition that causes pain in the temporomandibular joints. These joints are located near your ears and allow your jaw to open and close. ?TMJ syndrome is often mild and goes away within a few weeks. However, sometimes the condition becomes a long-term (chronic) problem. ?Symptoms include an aching pain on the side of the head in the area of the TMJ, pain when chewing or  biting, and being unable to open your jaw all the way. You may also make a clicking sound when you open your mouth. ?TMJ syndrome often goes away on its own. If treatment is needed, it may include medicines to relieve pain, reduce inflammation, or relax the muscles. A splint, bite plate, or mouthpiece may also be used to prevent teeth grinding or jaw clenching. ?This information is not intended to replace advice given to you by your health care provider. Make sure you discuss any questions you have with your health care provider. ?Document Revised: 02/12/2021 Document Reviewed: 02/12/2021 ?Elsevier Patient Education ? 2022 Elsevier Inc. ? ?

## 2021-09-27 NOTE — Assessment & Plan Note (Signed)
Suspect some eustachian tube dysfunction as no clear sign of infection on exam today.  Patient with history of this developing in the past and resulted in the need for tube placement.  Advised trial of pseudoephedrine and Flonase for 1 to 2 weeks if no improvement update. Also placed ear nose and throat referral as his tube has fallen out and he does have a history of needing tubes for similar symptoms. ?

## 2021-10-04 ENCOUNTER — Encounter: Payer: Self-pay | Admitting: *Deleted

## 2021-10-20 NOTE — Telephone Encounter (Signed)
Tried to call pt to set up f/u appt with Dr Selena Batten. Phone is disconnected so I sent him a MyChart message. ?

## 2021-12-15 ENCOUNTER — Other Ambulatory Visit: Payer: Self-pay | Admitting: Family Medicine

## 2021-12-15 DIAGNOSIS — I10 Essential (primary) hypertension: Secondary | ICD-10-CM

## 2021-12-27 ENCOUNTER — Telehealth: Payer: Self-pay | Admitting: Family Medicine

## 2021-12-27 DIAGNOSIS — E1165 Type 2 diabetes mellitus with hyperglycemia: Secondary | ICD-10-CM

## 2021-12-27 DIAGNOSIS — E1149 Type 2 diabetes mellitus with other diabetic neurological complication: Secondary | ICD-10-CM

## 2021-12-27 NOTE — Telephone Encounter (Signed)
Tried to call to schedule , VM not setup. Sent mychart message for pt to call us back

## 2022-01-08 ENCOUNTER — Other Ambulatory Visit: Payer: Self-pay | Admitting: Family Medicine

## 2022-01-08 DIAGNOSIS — E1165 Type 2 diabetes mellitus with hyperglycemia: Secondary | ICD-10-CM

## 2022-01-09 ENCOUNTER — Encounter: Payer: 59 | Admitting: Family Medicine

## 2022-01-24 MED ORDER — GLYBURIDE 1.25 MG PO TABS: 1.2500 mg | ORAL_TABLET | Freq: Every morning | ORAL | 0 refills | Status: DC

## 2022-01-24 NOTE — Telephone Encounter (Signed)
He already has an apt for this Friday 01/26/22, is this okay if he does this in the same apt? He would prefer to do it all at once instead of coming in on separate days, would that be okay with Selena Batten? Please advise thank you.  Callback #: [phone]

## 2022-01-24 NOTE — Telephone Encounter (Signed)
Pt called wanting to let the nurse and Selena Batten know that "he has not received his medication for: glyBURIDE (DIABETA) 1.25 MG tablet. He has been trying since 12/27/21, he called the pharmacy this morning and they stated "they did not see it and it was closed out on 12/27/21." They just resent a new authorization to [facility] about 10-15 minutes ago, pt wants to speak with the nurse about this because he needs his medication and he is no sure if it is being overlooked or what's going on. Please advise when possible, thank you.  Callback [phone]

## 2022-01-24 NOTE — Addendum Note (Signed)
Addended by: Consuella Lose on: 01/24/2022 12:20 PM   Modules accepted: Orders

## 2022-01-24 NOTE — Telephone Encounter (Signed)
Patient made an appointment for 01/26/22. Refill provided.

## 2022-01-26 ENCOUNTER — Ambulatory Visit (INDEPENDENT_AMBULATORY_CARE_PROVIDER_SITE_OTHER): Payer: 59 | Admitting: Family Medicine

## 2022-01-26 ENCOUNTER — Telehealth: Payer: Self-pay | Admitting: Pharmacist

## 2022-01-26 VITALS — BP 120/82 | HR 69 | Temp 97.5°F | Ht 71.75 in | Wt 248.2 lb

## 2022-01-26 DIAGNOSIS — B070 Plantar wart: Secondary | ICD-10-CM | POA: Diagnosis not present

## 2022-01-26 DIAGNOSIS — E785 Hyperlipidemia, unspecified: Secondary | ICD-10-CM

## 2022-01-26 DIAGNOSIS — Z794 Long term (current) use of insulin: Secondary | ICD-10-CM | POA: Diagnosis not present

## 2022-01-26 DIAGNOSIS — E11319 Type 2 diabetes mellitus with unspecified diabetic retinopathy without macular edema: Secondary | ICD-10-CM

## 2022-01-26 DIAGNOSIS — E1169 Type 2 diabetes mellitus with other specified complication: Secondary | ICD-10-CM

## 2022-01-26 DIAGNOSIS — I10 Essential (primary) hypertension: Secondary | ICD-10-CM

## 2022-01-26 LAB — POCT GLYCOSYLATED HEMOGLOBIN (HGB A1C): Hemoglobin A1C: 10.8 % — AB (ref 4.0–5.6)

## 2022-01-26 LAB — COMPREHENSIVE METABOLIC PANEL
ALT: 12 U/L (ref 0–53)
AST: 18 U/L (ref 0–37)
Albumin: 4.6 g/dL (ref 3.5–5.2)
Alkaline Phosphatase: 64 U/L (ref 39–117)
BUN: 21 mg/dL (ref 6–23)
CO2: 30 mEq/L (ref 19–32)
Calcium: 9.9 mg/dL (ref 8.4–10.5)
Chloride: 101 mEq/L (ref 96–112)
Creatinine, Ser: 1.19 mg/dL (ref 0.40–1.50)
GFR: 75 mL/min (ref >60.00)
Glucose, Bld: 118 mg/dL — ABNORMAL HIGH (ref 70–99)
Potassium: 4.4 mEq/L (ref 3.5–5.1)
Sodium: 139 mEq/L (ref 135–145)
Total Bilirubin: 0.7 mg/dL (ref 0.2–1.2)
Total Protein: 7.6 g/dL (ref 6.0–8.3)

## 2022-01-26 LAB — LIPID PANEL
Cholesterol: 160 mg/dL (ref 0–200)
HDL: 47.9 mg/dL (ref >39.00)
LDL Cholesterol: 101 mg/dL — ABNORMAL HIGH (ref 0–99)
NonHDL: 112.22
Total CHOL/HDL Ratio: 3
Triglycerides: 57 mg/dL (ref 0.0–149.0)
VLDL: 11.4 mg/dL (ref 0.0–40.0)

## 2022-01-26 MED ORDER — LOSARTAN POTASSIUM-HCTZ 100-25 MG PO TABS: 1.0000 | ORAL_TABLET | Freq: Every day | ORAL | 2 refills | Status: DC

## 2022-01-26 MED ORDER — ATORVASTATIN CALCIUM 20 MG PO TABS: 20.0000 mg | ORAL_TABLET | Freq: Every day | ORAL | 1 refills | Status: DC

## 2022-01-26 MED ORDER — OZEMPIC (0.25 OR 0.5 MG/DOSE) 2 MG/3ML ~~LOC~~ SOPN: PEN_INJECTOR | SUBCUTANEOUS | 0 refills | Status: DC

## 2022-01-26 MED ORDER — METOPROLOL SUCCINATE ER 100 MG PO TB24: 100.0000 mg | ORAL_TABLET | Freq: Every day | ORAL | 1 refills | Status: DC

## 2022-01-26 MED ORDER — AMLODIPINE BESYLATE 10 MG PO TABS: 10.0000 mg | ORAL_TABLET | Freq: Every day | ORAL | 3 refills | Status: DC

## 2022-01-26 MED ORDER — METFORMIN HCL ER 750 MG PO TB24: 750.0000 mg | ORAL_TABLET | Freq: Every day | ORAL | 1 refills | Status: DC

## 2022-01-26 NOTE — Assessment & Plan Note (Signed)
Trial of OTC treatment. Consider Liquid N2 if no response

## 2022-01-26 NOTE — Patient Instructions (Addendum)
Increase atorvastatin to 20 mg  I will check with pharmacy about adding another diabetic agent   Plan to update every 3-4 weeks for dose increase  Wart on Foot - try over the counter salicylic acid treatments (warts/corns) patches for up to 4 weeks if needed

## 2022-01-26 NOTE — Assessment & Plan Note (Signed)
Lab Results  Component Value Date   HGBA1C 10.8 (A) 01/26/2022   Poorly controlled.  Start Ozempic 0.25 mg with plan to increase monthly as tolerated.  Continue Toujeo 86 units, metformin 750 mg daily, glyburide 1.25 mg daily.  Follow-up in 6 weeks to check in, continue monitoring home CBGs.

## 2022-01-26 NOTE — Assessment & Plan Note (Signed)
Lab Results  Component Value Date   LDLCALC 124 (H) 11/23/2020   Increase atorvastatin to 20 mg. Labs today

## 2022-01-26 NOTE — Progress Notes (Signed)
Subjective:     Travis Scott is a 41 y.o. male presenting for Diabetes     HPI  #Diabetes Currently taking ran out of glyburide 3 weeks ago, has been taking toujeo 86 units and metformin 750 mg  Using medications without difficulties: Yes Hypoglycemic episodes:No  Hyperglycemic episodes:Yes  - 245 after breakfast Feet problems:No  - no tingling/numbness, will get pain on the bottom of the foot which radiates Blood Sugars averaging: 100-160 Last HgbA1c:  Lab Results  Component Value Date   HGBA1C 10.8 (A) 01/26/2022   Diet: has been juicing - realized this might not be good b/c of the sugars, some eating out, does not follow a diet Not exercising as much - did start back walking   Did get an eye exam in April   Diabetes Health Maintenance Due:    Diabetes Health Maintenance Due  Topic Date Due   OPHTHALMOLOGY EXAM  10/27/2021   HEMOGLOBIN A1C  07/29/2022   FOOT EXAM  01/27/2023       Review of Systems   Social History   Tobacco Use  Smoking Status Some Days   Types: Cigars  Smokeless Tobacco Never  Tobacco Comments   maybe 2 times in 6 months period        Objective:    BP Readings from Last 3 Encounters:  01/26/22 120/82  09/27/21 110/72  05/02/21 (!) 150/96   Wt Readings from Last 3 Encounters:  01/26/22 248 lb 4 oz (112.6 kg)  09/27/21 253 lb 8 oz (115 kg)  05/02/21 253 lb 3 oz (114.8 kg)    BP 120/82   Pulse 69   Temp (!) 97.5 F (36.4 C) (Temporal)   Ht 5' 11.75" (1.822 m)   Wt 248 lb 4 oz (112.6 kg)   SpO2 98%   BMI 33.90 kg/m    Physical Exam Constitutional:      Appearance: Normal appearance. He is not ill-appearing or diaphoretic.  HENT:     Right Ear: External ear normal.     Left Ear: External ear normal.     Nose: Nose normal.  Eyes:     General: No scleral icterus.    Extraocular Movements: Extraocular movements intact.     Conjunctiva/sclera: Conjunctivae normal.  Cardiovascular:     Rate and Rhythm: Normal  rate.  Pulmonary:     Effort: Pulmonary effort is normal.  Musculoskeletal:     Cervical back: Neck supple.  Skin:    General: Skin is warm and dry.  Neurological:     Mental Status: He is alert. Mental status is at baseline.  Psychiatric:        Mood and Affect: Mood normal.           Assessment & Plan:   Problem List Items Addressed This Visit       Cardiovascular and Mediastinum   Essential hypertension    Switch metoprolol tartrate to succinate 100 mg. Cont amlodipine 10 mg, hyzaar 100-25 mg. Labs today      Relevant Medications   amLODipine (NORVASC) 10 MG tablet   atorvastatin (LIPITOR) 20 MG tablet   losartan-hydrochlorothiazide (HYZAAR) 100-25 MG tablet   metoprolol succinate (TOPROL-XL) 100 MG 24 hr tablet   Other Relevant Orders   Comprehensive metabolic panel     Endocrine   Hyperlipidemia associated with type 2 diabetes mellitus (HCC)    Lab Results  Component Value Date   LDLCALC 124 (H) 11/23/2020  Increase atorvastatin to 20 mg.  Labs today      Relevant Medications   amLODipine (NORVASC) 10 MG tablet   atorvastatin (LIPITOR) 20 MG tablet   losartan-hydrochlorothiazide (HYZAAR) 100-25 MG tablet   metFORMIN (GLUCOPHAGE-XR) 750 MG 24 hr tablet   metoprolol succinate (TOPROL-XL) 100 MG 24 hr tablet   Other Relevant Orders   Lipid panel   Diabetes mellitus (HCC) - Primary   Relevant Medications   atorvastatin (LIPITOR) 20 MG tablet   losartan-hydrochlorothiazide (HYZAAR) 100-25 MG tablet   metFORMIN (GLUCOPHAGE-XR) 750 MG 24 hr tablet   Other Relevant Orders   POCT glycosylated hemoglobin (Hb A1C) (Completed)   Diabetic retinopathy associated with type 2 diabetes mellitus (HCC)    Lab Results  Component Value Date   HGBA1C 10.8 (A) 01/26/2022  Poorly controlled.  Start Ozempic 0.25 mg with plan to increase monthly as tolerated.  Continue Toujeo 86 units, metformin 750 mg daily, glyburide 1.25 mg daily.  Follow-up in 6 weeks to check in,  continue monitoring home CBGs.      Relevant Medications   atorvastatin (LIPITOR) 20 MG tablet   losartan-hydrochlorothiazide (HYZAAR) 100-25 MG tablet   metFORMIN (GLUCOPHAGE-XR) 750 MG 24 hr tablet     Musculoskeletal and Integument   Plantar wart    Trial of OTC treatment. Consider Liquid N2 if no response        Return in about 6 weeks (around 03/09/2022) for annual exam.  Lynnda Child, MD

## 2022-01-26 NOTE — Assessment & Plan Note (Signed)
Switch metoprolol tartrate to succinate 100 mg. Cont amlodipine 10 mg, hyzaar 100-25 mg. Labs today

## 2022-01-26 NOTE — Telephone Encounter (Signed)
Reviewed diabetic agents on insurance formulary.  His [employer] healthplan has Ozempic and Mounjaro on Tier 2, and would require a PA. Lawana Chambers is Tier 2 as well and should be covered without a PA. It looks like his health plan does have a deductible, so initial copays may be very high regardless.  Recommend trial of Ozempic first as it is likely to be the most effective option. Spoke with PCP and we will start Ozempic 0.25 mg weekly x 4 weeks, then increase to 0.5 mg.  Copay card available: BIN [id number]; PCN [id number]; GRP [id number]; ID [id number]

## 2022-02-22 ENCOUNTER — Other Ambulatory Visit: Payer: Self-pay | Admitting: Family Medicine

## 2022-02-22 DIAGNOSIS — I10 Essential (primary) hypertension: Secondary | ICD-10-CM

## 2022-02-22 DIAGNOSIS — E1169 Type 2 diabetes mellitus with other specified complication: Secondary | ICD-10-CM

## 2022-04-02 ENCOUNTER — Telehealth: Payer: Self-pay | Admitting: Family Medicine

## 2022-04-02 DIAGNOSIS — E1169 Type 2 diabetes mellitus with other specified complication: Secondary | ICD-10-CM

## 2022-04-03 NOTE — Telephone Encounter (Signed)
Called pt, no answer. Left vm to call back

## 2022-04-13 ENCOUNTER — Telehealth (INDEPENDENT_AMBULATORY_CARE_PROVIDER_SITE_OTHER): Payer: 59 | Admitting: Family Medicine

## 2022-04-13 ENCOUNTER — Ambulatory Visit: Payer: 59 | Admitting: Family Medicine

## 2022-04-13 DIAGNOSIS — E1149 Type 2 diabetes mellitus with other diabetic neurological complication: Secondary | ICD-10-CM | POA: Diagnosis not present

## 2022-04-13 DIAGNOSIS — E1169 Type 2 diabetes mellitus with other specified complication: Secondary | ICD-10-CM | POA: Diagnosis not present

## 2022-04-13 DIAGNOSIS — J069 Acute upper respiratory infection, unspecified: Secondary | ICD-10-CM

## 2022-04-13 DIAGNOSIS — E1165 Type 2 diabetes mellitus with hyperglycemia: Secondary | ICD-10-CM

## 2022-04-13 DIAGNOSIS — Z794 Long term (current) use of insulin: Secondary | ICD-10-CM

## 2022-04-13 DIAGNOSIS — E785 Hyperlipidemia, unspecified: Secondary | ICD-10-CM

## 2022-04-13 MED ORDER — GEMFIBROZIL 600 MG PO TABS
ORAL_TABLET | ORAL | 0 refills | Status: DC
Start: 2022-04-13 — End: 2023-02-12

## 2022-04-13 MED ORDER — GLYBURIDE 1.25 MG PO TABS: 1.2500 mg | ORAL_TABLET | Freq: Every morning | ORAL | 0 refills | Status: DC

## 2022-04-13 MED ORDER — FREESTYLE LIBRE 3 SENSOR MISC: 3 refills | Status: AC

## 2022-04-13 MED ORDER — TOUJEO MAX SOLOSTAR 300 UNIT/ML ~~LOC~~ SOPN: 100.0000 [IU] | PEN_INJECTOR | Freq: Every day | SUBCUTANEOUS | 3 refills | Status: DC

## 2022-04-13 MED ORDER — OZEMPIC (0.25 OR 0.5 MG/DOSE) 2 MG/3ML ~~LOC~~ SOPN: 0.5000 mg | PEN_INJECTOR | SUBCUTANEOUS | 0 refills | Status: DC

## 2022-04-13 NOTE — Patient Instructions (Signed)
Diabetes - increase ozempic to 0.5 mg weekly - return in 3 weeks - continue other medications  Based on your symptoms, it looks like you have a virus.   Antibiotics are not need for a viral infection but the following will help:   Drink plenty of fluids Get lots of rest  Sinus Congestion 1) Neti Pot (Saline rinse) -- 2 times day -- if tolerated 2) Flonase (Store Brand ok) - once daily 3) Over the counter congestion medications  Cough 1) Cough drops can be helpful 2) Nyquil (or nighttime cough medication) 3) Honey is proven to be one of the best cough medications  4) Cough medicine with Dextromethorphan can also be helpful  Sore Throat 1) Honey as above, cough drops 2) Ibuprofen or Aleve can be helpful 3) Salt water Gargles  If you develop fevers (Temperature >100.4), chills, worsening symptoms or symptoms lasting longer than 10 days return to clinic.

## 2022-04-13 NOTE — Progress Notes (Signed)
I connected with Travis Scott on 04/13/22 at  8:40 AM EDT by video and verified that I am speaking with the correct person using two identifiers.   I discussed the limitations, risks, security and privacy concerns of performing an evaluation and management service by video and the availability of in person appointments. I also discussed with the patient that there may be a patient responsible charge related to this service. The patient expressed understanding and agreed to proceed.  Patient location: parked car Provider Location: [place] Participants: Lesleigh Noe and Sani Madariaga Trost   Subjective:     Travis Scott is a 41 y.o. male presenting for Medication Refill and Nasal Congestion (Cough, chills and fatigue, started on Monday. Tested negative on Tuesday with home test. Feeling a little better. )     Medication Refill Associated symptoms include arthralgias, chills, congestion, coughing, fatigue and myalgias. Pertinent negatives include no chest pain, fever, nausea or vomiting.  Sinusitis This is a new problem. The current episode started in the past 7 days. The problem has been gradually improving since onset. There has been no fever. Associated symptoms include chills, congestion, coughing and sneezing. Pertinent negatives include no ear pain, shortness of breath or sinus pressure. Past treatments include oral decongestants and acetaminophen. The treatment provided mild relief.    #Diabetes - CBG 117-150 - fasting - taking toujeo 100 units - taking ozempic 0.25 mg - has been working fine - ran out of this medication -   Review of Systems  Constitutional:  Positive for chills and fatigue. Negative for fever.  HENT:  Positive for congestion, rhinorrhea, sneezing and voice change. Negative for ear pain and sinus pressure.   Respiratory:  Positive for cough. Negative for shortness of breath.   Cardiovascular:  Negative for chest pain.   Gastrointestinal:  Negative for diarrhea, nausea and vomiting.  Musculoskeletal:  Positive for arthralgias and myalgias.     Social History   Tobacco Use  Smoking Status Some Days   Types: Cigars  Smokeless Tobacco Never  Tobacco Comments   maybe 2 times in 6 months period        Objective:   BP Readings from Last 3 Encounters:  01/26/22 120/82  09/27/21 110/72  05/02/21 (!) 150/96   Wt Readings from Last 3 Encounters:  01/26/22 248 lb 4 oz (112.6 kg)  09/27/21 253 lb 8 oz (115 kg)  05/02/21 253 lb 3 oz (114.8 kg)    There were no vitals taken for this visit.   Physical Exam Constitutional:      Appearance: Normal appearance. He is not ill-appearing.  HENT:     Head: Normocephalic and atraumatic.     Right Ear: External ear normal.     Left Ear: External ear normal.  Eyes:     Conjunctiva/sclera: Conjunctivae normal.  Pulmonary:     Effort: Pulmonary effort is normal. No respiratory distress.  Neurological:     Mental Status: He is alert. Mental status is at baseline.  Psychiatric:        Mood and Affect: Mood normal.        Behavior: Behavior normal.        Thought Content: Thought content normal.        Judgment: Judgment normal.          Assessment & Plan:   Problem List Items Addressed This Visit       Endocrine   Hyperlipidemia associated with type 2  diabetes mellitus ([place])    Lab Results  Component Value Date   CHOL 160 01/26/2022   HDL 47.90 01/26/2022   LDLCALC 101 (H) 01/26/2022   LDLDIRECT 191.3 04/10/2011   TRIG 57.0 01/26/2022   CHOLHDL 3 01/26/2022  Cont gembibrozil and atorvastatin. Cont healthy diet.        Relevant Medications   gemfibrozil (LOPID) 600 MG tablet   glyBURIDE (DIABETA) 1.25 MG tablet   insulin glargine, 2 Unit Dial, (TOUJEO MAX SOLOSTAR) 300 UNIT/ML Solostar Pen   Semaglutide,0.25 or 0.5MG/DOS, (OZEMPIC, 0.25 OR 0.5 MG/DOSE,) 2 MG/3ML SOPN   Diabetes mellitus ([place]) - Primary    Improving but not at  goal. Cont insulin 100 units daily. Increase ozempic to 0.5 mg as tolerated. Cont glyburide 1.25 mg and metformin 750 mg. Will try to start free style libre if covered to improve monitoring and control.       Relevant Medications   glyBURIDE (DIABETA) 1.25 MG tablet   insulin glargine, 2 Unit Dial, (TOUJEO MAX SOLOSTAR) 300 UNIT/ML Solostar Pen   Semaglutide,0.25 or 0.5MG/DOS, (OZEMPIC, 0.25 OR 0.5 MG/DOSE,) 2 MG/3ML SOPN   Continuous Blood Gluc Sensor (FREESTYLE LIBRE 3 SENSOR) MISC   Other Visit Diagnoses     Type 2 diabetes mellitus with neurological complications (HCC)       Relevant Medications   glyBURIDE (DIABETA) 1.25 MG tablet   insulin glargine, 2 Unit Dial, (TOUJEO MAX SOLOSTAR) 300 UNIT/ML Solostar Pen   Semaglutide,0.25 or 0.5MG/DOS, (OZEMPIC, 0.25 OR 0.5 MG/DOSE,) 2 MG/3ML SOPN   Uncontrolled type 2 diabetes mellitus with hyperglycemia (HCC)       Relevant Medications   glyBURIDE (DIABETA) 1.25 MG tablet   insulin glargine, 2 Unit Dial, (TOUJEO MAX SOLOSTAR) 300 UNIT/ML Solostar Pen   Semaglutide,0.25 or 0.5MG/DOS, (OZEMPIC, 0.25 OR 0.5 MG/DOSE,) 2 MG/3ML SOPN   Viral URI with cough            Return in about 3 weeks (around 05/04/2022) for TOC and DM with [employer] .  Lesleigh Noe, MD

## 2022-04-13 NOTE — Assessment & Plan Note (Signed)
Lab Results  Component Value Date   CHOL 160 01/26/2022   HDL 47.90 01/26/2022   LDLCALC 101 (H) 01/26/2022   LDLDIRECT 191.3 04/10/2011   TRIG 57.0 01/26/2022   CHOLHDL 3 01/26/2022  Cont gembibrozil and atorvastatin. Cont healthy diet.

## 2022-04-13 NOTE — Assessment & Plan Note (Signed)
Improving but not at goal. Cont insulin 100 units daily. Increase ozempic to 0.5 mg as tolerated. Cont glyburide 1.25 mg and metformin 750 mg. Will try to start free style libre if covered to improve monitoring and control.

## 2022-04-14 ENCOUNTER — Encounter: Payer: Self-pay | Admitting: Family Medicine

## 2022-04-16 MED ORDER — INSULIN PEN NEEDLE 32G X 4 MM MISC: 1.0000 | Freq: Every day | 3 refills | Status: DC

## 2022-08-02 ENCOUNTER — Other Ambulatory Visit: Payer: Self-pay

## 2022-08-02 DIAGNOSIS — E1149 Type 2 diabetes mellitus with other diabetic neurological complication: Secondary | ICD-10-CM

## 2022-08-02 DIAGNOSIS — E1169 Type 2 diabetes mellitus with other specified complication: Secondary | ICD-10-CM

## 2022-08-02 DIAGNOSIS — E1165 Type 2 diabetes mellitus with hyperglycemia: Secondary | ICD-10-CM

## 2022-08-02 NOTE — Telephone Encounter (Signed)
Prescription Request  08/02/2022  Is this a "Controlled Substance" medicine? No  LOV: 04/13/22 and was to FU in 3 wks with TOC & DM FU. DO NOT SEE WHERE APPT SCHEDULED. NO TOC SCHEDULED.  What is the name of the medication or equipment? GLYBURIDE 1.25 MG AND OZEMPIC 0.5 MG  Have you contacted your pharmacy to request a refill? Yes   Which pharmacy would you like this sent to?  [facility] [place], [address] AT [address] [place] [address] Phone: [phone] Fax: [phone]    Patient notified that their request is being sent to the clinical staff for review and that they should receive a response within 2 business days.   Last refill on Glyburide 1.25 mg # 90 on 04/13/22 Last refill on Ozempic 0.5 mg # 55ml on 04/13/22.    Refill request sent by [facility] [address].     Sending note to Dorothy Puffer FNP.

## 2022-08-03 MED ORDER — GLYBURIDE 1.25 MG PO TABS: 1.2500 mg | ORAL_TABLET | Freq: Every morning | ORAL | 0 refills | Status: DC

## 2022-08-03 MED ORDER — OZEMPIC (0.25 OR 0.5 MG/DOSE) 2 MG/3ML ~~LOC~~ SOPN: 0.5000 mg | PEN_INJECTOR | SUBCUTANEOUS | 0 refills | Status: DC

## 2022-10-26 ENCOUNTER — Ambulatory Visit: Payer: 59 | Admitting: Nurse Practitioner

## 2022-10-26 ENCOUNTER — Encounter: Payer: Self-pay | Admitting: Nurse Practitioner

## 2022-10-26 VITALS — BP 120/78 | HR 76 | Temp 98.1°F | Ht 71.0 in | Wt 247.6 lb

## 2022-10-26 DIAGNOSIS — E1165 Type 2 diabetes mellitus with hyperglycemia: Secondary | ICD-10-CM

## 2022-10-26 DIAGNOSIS — E785 Hyperlipidemia, unspecified: Secondary | ICD-10-CM

## 2022-10-26 DIAGNOSIS — I10 Essential (primary) hypertension: Secondary | ICD-10-CM | POA: Diagnosis not present

## 2022-10-26 DIAGNOSIS — L84 Corns and callosities: Secondary | ICD-10-CM

## 2022-10-26 DIAGNOSIS — Z794 Long term (current) use of insulin: Secondary | ICD-10-CM

## 2022-10-26 DIAGNOSIS — Z7985 Long-term (current) use of injectable non-insulin antidiabetic drugs: Secondary | ICD-10-CM

## 2022-10-26 NOTE — Patient Instructions (Signed)
Labs ordered  Follow up in 1 month

## 2022-10-26 NOTE — Progress Notes (Signed)
New Patient Office Visit  Subjective    Patient ID: Travis Scott, male    DOB: 09-Nov-1980  Age: 42 y.o. MRN: [id number]  CC:  Chief Complaint  Patient presents with   New Patient (Initial Visit)    Callous on foot/stomach issues    HPI Unnamed Travis Scott presents to establish care.  His previous PCP Dr. Shanda Bumps at [facility]. He has h/o hypertension, diabetes, hyperlipidemia, triglyceride.  BS at home 100- 115. He feels constipated, bloated and stomach discomfort. Last BM: 10/26/22   Health Maintenance  Topic Date Due   Diabetic kidney evaluation - Urine ACR  10/13/2019   OPHTHALMOLOGY EXAM  10/27/2021   COVID-19 Vaccine (3 - 2023-24 season) 11/11/2022 (Originally 03/16/2022)   FOOT EXAM  01/27/2023   INFLUENZA VACCINE  02/14/2023   HEMOGLOBIN A1C  04/27/2023   Diabetic kidney evaluation - eGFR measurement  10/26/2023   DTaP/Tdap/Td (2 - Td or Tdap) 06/30/2028   Hepatitis C Screening  Completed   HIV Screening  Completed   HPV VACCINES  Aged Out    There are no preventive care reminders to display for this patient.  Outpatient Encounter Medications as of 10/26/2022  Medication Sig   amLODipine (NORVASC) 10 MG tablet Take 1 tablet (10 mg total) by mouth daily.   atorvastatin (LIPITOR) 20 MG tablet Take 1 tablet (20 mg total) by mouth daily.   Blood Glucose Monitoring Suppl (ONETOUCH VERIO FLEX SYSTEM) w/Device KIT USE UP TO FOUR TIMES DAILY AS DIRECTED E11.9   Continuous Blood Gluc Sensor (FREESTYLE LIBRE 3 SENSOR) MISC Apply sensor every 14 days.   gemfibrozil (LOPID) 600 MG tablet TAKE 1 TABLET(600 MG) BY MOUTH TWICE DAILY   glyBURIDE (DIABETA) 1.25 MG tablet Take 1 tablet (1.25 mg total) by mouth every morning.   insulin glargine, 2 Unit Dial, (TOUJEO MAX SOLOSTAR) 300 UNIT/ML Solostar Pen Inject 100 Units into the skin at bedtime.   Insulin Pen Needle 32G X 4 MM MISC 1 each by Does not apply route daily.   Lancets (ONETOUCH DELICA PLUS LANCET33G) MISC Apply  topically.   losartan-hydrochlorothiazide (HYZAAR) 100-25 MG tablet Take 1 tablet by mouth daily.   metFORMIN (GLUCOPHAGE-XR) 750 MG 24 hr tablet Take 1 tablet (750 mg total) by mouth daily with breakfast.   metoprolol succinate (TOPROL-XL) 100 MG 24 hr tablet Take 1 tablet (100 mg total) by mouth daily. Take with or immediately following a meal.   ONETOUCH VERIO test strip USE UP TO FOUR TIMES DAILY AS DIRECTED   Semaglutide,0.25 or 0.5MG /DOS, (OZEMPIC, 0.25 OR 0.5 MG/DOSE,) 2 MG/3ML SOPN Inject 0.5 mg into the skin once a week.   cyclobenzaprine (FLEXERIL) 10 MG tablet Take 10 mg by mouth 2 (two) times daily as needed. (Patient not taking: Reported on 10/26/2022)   diclofenac (VOLTAREN) 50 MG EC tablet Take 50 mg by mouth 2 (two) times daily as needed. (Patient not taking: Reported on 10/26/2022)   No facility-administered encounter medications on file as of 10/26/2022.    Past Medical History:  Diagnosis Date   Chronic back pain    Diabetes mellitus    Hypertension    Kidney stones     Past Surgical History:  Procedure Laterality Date   ear tubes with butterfly clip put in Right    for fluid drainage    Family History  Problem Relation Age of Onset   Hypertension Mother    Diabetes Mother    Diabetes Father    Hypertension Father  Gout Father    Healthy Sister    Breast cancer Maternal Grandmother    Diabetes Maternal Grandfather    Hypertension Maternal Grandfather    Cancer Paternal Grandmother        not sure of the type   Stroke Paternal Grandmother 67   Diabetes Paternal Grandfather     Social History   Socioeconomic History   Marital status: Married    Spouse name: Sinquetta   Number of children: 3   Years of education: masters degree   Highest education level: Not on file  Occupational History    Employer: [employer]  Tobacco Use   Smoking status: Some Days    Types: Cigars   Smokeless tobacco: Never   Tobacco comments:    maybe 2 times in 6 months  period  Vaping Use   Vaping Use: Never used  Substance and Sexual Activity   Alcohol use: Not Currently    Comment: once every few months   Drug use: No   Sexual activity: Yes    Birth control/protection: None  Other Topics Concern   Not on file  Social History Narrative   11/30/19   From: [employer] and came to [facility] [employer]    Living: with Sinquetta (2006) and children   Work: Investment banker, 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      Family: 3 children Office manager (2008), [name] (2010), [name] (2015)       Enjoys: get outside, travel, race track, beach/mountains, listen to music, coloring books      Exercise: walking - 3 times a week 3-5 miles    Diet: trying to follow diabetic diet      Safety   Seat belts: Yes    Guns: Yes  and secure   Safe in relationships: Yes    Social Determinants of Health   Financial Resource Strain: Not on file  Food Insecurity: Not on file  Transportation Needs: Not on file  Physical Activity: Not on file  Stress: Not on file  Social Connections: Not on file  Intimate Partner Violence: Not on file    Review of Systems  Constitutional: Negative.   HENT: Negative.    Cardiovascular:  Negative for chest pain and leg swelling.  Gastrointestinal:  Positive for abdominal pain and constipation.  Musculoskeletal:        Callus bilaterally big toe.  Neurological: Negative.   Psychiatric/Behavioral: Negative.          Objective    BP 120/78   Pulse 76   Temp 98.1 F (36.7 C) (Oral)   Ht 5\' 11"  (1.803 m)   Wt 247 lb 9.6 oz (112.3 kg)   SpO2 99%   BMI 34.53 kg/m   Physical Exam Constitutional:      Appearance: Normal appearance. He is obese.  HENT:     Head: Normocephalic.     Right Ear: Tympanic membrane normal.     Left Ear: Tympanic membrane normal.     Nose: Nose normal.     Mouth/Throat:     Mouth: Mucous membranes are moist.  Cardiovascular:     Rate and Rhythm: Normal rate and regular rhythm.     Pulses:  Normal pulses.     Heart sounds: Normal heart sounds. No murmur heard. Pulmonary:     Effort: Pulmonary effort is normal.     Breath sounds: Normal breath sounds.  Abdominal:     General: Bowel sounds are normal.     Palpations:  Abdomen is soft.     Tenderness: There is no abdominal tenderness.     Hernia: No hernia is present.  Musculoskeletal:        General: No tenderness or deformity.     Comments: Dry skin dorsal aspect bilateral foot    Skin:    General: Skin is warm.  Neurological:     General: No focal deficit present.     Mental Status: He is alert and oriented to person, place, and time. Mental status is at baseline.  Psychiatric:        Mood and Affect: Mood normal.        Behavior: Behavior normal.        Thought Content: Thought content normal.        Judgment: Judgment normal.         Assessment & Plan:  Essential hypertension Assessment & Plan: Patient BP  Vitals:   10/26/22 1414  BP: 120/78    in the office. Advised pt to follow a low sodium and heart healthy diet. Continue losartan/HCTZ, metoprolol and amlodipine  Orders: -     CBC with Differential/Platelet -     Comprehensive metabolic panel -     TSH  Type 2 diabetes mellitus with hyperglycemia, with long-term current use of insulin Assessment & Plan: Previous hemoglobin A1c 10.8 on 01/26/2022. Encouraged him to continue Ozempic 0.5 mg once weekly, glyburide 1.25 and insulin. Will check hemoglobin A1  Orders: -     Hemoglobin A1c  Hyperlipidemia, unspecified hyperlipidemia type Assessment & Plan: Last LDL 1.1 on 01/26/2022. Continue Lipitor 20 mg. Will check lipid panel today  Orders: -     Lipid panel  Foot callus Assessment & Plan: Advised patient to use OTC callus removal pads.     Return in about 3 months (around 01/25/2023) for chronic management.   [name], NP

## 2022-10-27 LAB — COMPREHENSIVE METABOLIC PANEL
ALT: 27 IU/L (ref 0–44)
AST: 39 IU/L (ref 0–40)
Albumin/Globulin Ratio: 1.7 (ref 1.2–2.2)
Albumin: 4.4 g/dL (ref 4.1–5.1)
Alkaline Phosphatase: 65 IU/L (ref 44–121)
BUN/Creatinine Ratio: 9 (ref 9–20)
BUN: 10 mg/dL (ref 6–24)
Bilirubin Total: 0.4 mg/dL (ref 0.0–1.2)
CO2: 22 mmol/L (ref 20–29)
Calcium: 9.7 mg/dL (ref 8.7–10.2)
Chloride: 104 mmol/L (ref 96–106)
Creatinine, Ser: 1.15 mg/dL (ref 0.76–1.27)
Globulin, Total: 2.6 g/dL (ref 1.5–4.5)
Glucose: 78 mg/dL (ref 70–99)
Potassium: 4.7 mmol/L (ref 3.5–5.2)
Sodium: 141 mmol/L (ref 134–144)
Total Protein: 7 g/dL (ref 6.0–8.5)
eGFR: 81 mL/min/{1.73_m2} (ref >59)

## 2022-10-27 LAB — HEMOGLOBIN A1C
Est. average glucose Bld gHb Est-mCnc: 183 mg/dL
Hgb A1c MFr Bld: 8 % — ABNORMAL HIGH (ref 4.8–5.6)

## 2022-10-27 LAB — CBC WITH DIFFERENTIAL/PLATELET
Basophils Absolute: 0.1 10*3/uL (ref 0.0–0.2)
Basos: 1 %
EOS (ABSOLUTE): 0.3 10*3/uL (ref 0.0–0.4)
Eos: 5 %
Hematocrit: 38.7 % (ref 37.5–51.0)
Hemoglobin: 13.2 g/dL (ref 13.0–17.7)
Immature Grans (Abs): 0 10*3/uL (ref 0.0–0.1)
Immature Granulocytes: 0 %
Lymphocytes Absolute: 1.9 10*3/uL (ref 0.7–3.1)
Lymphs: 33 %
MCH: 29.3 pg (ref 26.6–33.0)
MCHC: 34.1 g/dL (ref 31.5–35.7)
MCV: 86 fL (ref 79–97)
Monocytes Absolute: 0.6 10*3/uL (ref 0.1–0.9)
Monocytes: 10 %
Neutrophils Absolute: 3 10*3/uL (ref 1.4–7.0)
Neutrophils: 51 %
Platelets: 239 10*3/uL (ref 150–450)
RBC: 4.5 x10E6/uL (ref 4.14–5.80)
RDW: 12.4 % (ref 11.6–15.4)
WBC: 5.8 10*3/uL (ref 3.4–10.8)

## 2022-10-27 LAB — LIPID PANEL
Chol/HDL Ratio: 2.9 ratio (ref 0.0–5.0)
Cholesterol, Total: 153 mg/dL (ref 100–199)
HDL: 53 mg/dL (ref >39)
LDL Chol Calc (NIH): 86 mg/dL (ref 0–99)
Triglycerides: 71 mg/dL (ref 0–149)
VLDL Cholesterol Cal: 14 mg/dL (ref 5–40)

## 2022-10-27 LAB — TSH: TSH: 0.72 u[IU]/mL (ref 0.450–4.500)

## 2022-11-05 ENCOUNTER — Encounter: Payer: Self-pay | Admitting: Nurse Practitioner

## 2022-11-05 DIAGNOSIS — L84 Corns and callosities: Secondary | ICD-10-CM | POA: Insufficient documentation

## 2022-11-05 NOTE — Assessment & Plan Note (Signed)
Patient BP  Vitals:   10/26/22 1414  BP: 120/78    in the office. Advised pt to follow a low sodium and heart healthy diet. Continue losartan/HCTZ, metoprolol and amlodipine

## 2022-11-05 NOTE — Assessment & Plan Note (Signed)
Advised patient to use OTC callus removal pads.

## 2022-11-05 NOTE — Assessment & Plan Note (Signed)
Last LDL 1.1 on 01/26/2022. Continue Lipitor 20 mg. Will check lipid panel today

## 2022-11-05 NOTE — Assessment & Plan Note (Signed)
Previous hemoglobin A1c 10.8 on 01/26/2022. Encouraged him to continue Ozempic 0.5 mg once weekly, glyburide 1.25 and insulin. Will check hemoglobin A1

## 2022-11-07 ENCOUNTER — Other Ambulatory Visit: Payer: Self-pay | Admitting: Family

## 2022-11-07 DIAGNOSIS — E1149 Type 2 diabetes mellitus with other diabetic neurological complication: Secondary | ICD-10-CM

## 2022-11-07 DIAGNOSIS — Z794 Long term (current) use of insulin: Secondary | ICD-10-CM

## 2022-11-07 DIAGNOSIS — E1165 Type 2 diabetes mellitus with hyperglycemia: Secondary | ICD-10-CM

## 2022-11-13 ENCOUNTER — Other Ambulatory Visit: Payer: Self-pay | Admitting: *Deleted

## 2022-11-13 ENCOUNTER — Other Ambulatory Visit: Payer: Self-pay

## 2022-11-13 ENCOUNTER — Encounter: Payer: Self-pay | Admitting: Nurse Practitioner

## 2022-11-13 DIAGNOSIS — E1165 Type 2 diabetes mellitus with hyperglycemia: Secondary | ICD-10-CM

## 2022-11-13 DIAGNOSIS — E1169 Type 2 diabetes mellitus with other specified complication: Secondary | ICD-10-CM

## 2022-11-13 DIAGNOSIS — E1149 Type 2 diabetes mellitus with other diabetic neurological complication: Secondary | ICD-10-CM

## 2022-11-13 MED ORDER — GLYBURIDE 1.25 MG PO TABS
1.2500 mg | ORAL_TABLET | Freq: Every morning | ORAL | 0 refills | Status: DC
Start: 2022-11-13 — End: 2023-02-12

## 2022-11-13 MED ORDER — ATORVASTATIN CALCIUM 20 MG PO TABS: 20.0000 mg | ORAL_TABLET | Freq: Every day | ORAL | 1 refills | Status: DC

## 2022-11-13 MED ORDER — OZEMPIC (0.25 OR 0.5 MG/DOSE) 2 MG/3ML ~~LOC~~ SOPN
0.5000 mg | PEN_INJECTOR | SUBCUTANEOUS | 0 refills | Status: DC
Start: 2022-11-13 — End: 2023-01-10

## 2023-01-09 ENCOUNTER — Other Ambulatory Visit: Payer: Self-pay | Admitting: Nurse Practitioner

## 2023-01-09 DIAGNOSIS — E1169 Type 2 diabetes mellitus with other specified complication: Secondary | ICD-10-CM

## 2023-01-09 DIAGNOSIS — E1149 Type 2 diabetes mellitus with other diabetic neurological complication: Secondary | ICD-10-CM

## 2023-01-09 DIAGNOSIS — E1165 Type 2 diabetes mellitus with hyperglycemia: Secondary | ICD-10-CM

## 2023-01-25 ENCOUNTER — Ambulatory Visit: Payer: 59 | Admitting: Nurse Practitioner

## 2023-02-07 ENCOUNTER — Ambulatory Visit: Payer: 59 | Admitting: Nurse Practitioner

## 2023-02-12 ENCOUNTER — Ambulatory Visit: Payer: 59 | Admitting: Nurse Practitioner

## 2023-02-12 ENCOUNTER — Encounter: Payer: Self-pay | Admitting: Nurse Practitioner

## 2023-02-12 ENCOUNTER — Telehealth: Payer: Self-pay | Admitting: Nurse Practitioner

## 2023-02-12 VITALS — BP 126/76 | HR 77 | Temp 98.4°F | Ht 72.0 in | Wt 248.8 lb

## 2023-02-12 DIAGNOSIS — E1169 Type 2 diabetes mellitus with other specified complication: Secondary | ICD-10-CM

## 2023-02-12 DIAGNOSIS — Z794 Long term (current) use of insulin: Secondary | ICD-10-CM | POA: Diagnosis not present

## 2023-02-12 DIAGNOSIS — E1165 Type 2 diabetes mellitus with hyperglycemia: Secondary | ICD-10-CM | POA: Diagnosis not present

## 2023-02-12 DIAGNOSIS — E785 Hyperlipidemia, unspecified: Secondary | ICD-10-CM

## 2023-02-12 DIAGNOSIS — L84 Corns and callosities: Secondary | ICD-10-CM | POA: Diagnosis not present

## 2023-02-12 DIAGNOSIS — I10 Essential (primary) hypertension: Secondary | ICD-10-CM

## 2023-02-12 LAB — MICROALBUMIN / CREATININE URINE RATIO
Creatinine,U: 164.6 mg/dL
Microalb Creat Ratio: 0.4 mg/g (ref 0.0–30.0)
Microalb, Ur: <0.7 mg/dL (ref 0.0–1.9)

## 2023-02-12 LAB — POCT GLYCOSYLATED HEMOGLOBIN (HGB A1C): Hemoglobin A1C: 6.7 % — AB (ref 4.0–5.6)

## 2023-02-12 MED ORDER — ATORVASTATIN CALCIUM 20 MG PO TABS: 20.0000 mg | ORAL_TABLET | Freq: Every day | ORAL | 1 refills | Status: DC

## 2023-02-12 MED ORDER — ONETOUCH VERIO VI STRP: ORAL_STRIP | 3 refills | Status: DC

## 2023-02-12 MED ORDER — METFORMIN HCL ER 750 MG PO TB24: 750.0000 mg | ORAL_TABLET | Freq: Every day | ORAL | 1 refills | Status: AC

## 2023-02-12 MED ORDER — GLYBURIDE 1.25 MG PO TABS: 1.2500 mg | ORAL_TABLET | Freq: Every morning | ORAL | 0 refills | Status: DC

## 2023-02-12 MED ORDER — METOPROLOL SUCCINATE ER 100 MG PO TB24: 100.0000 mg | ORAL_TABLET | Freq: Every day | ORAL | 1 refills | Status: DC

## 2023-02-12 MED ORDER — LOSARTAN POTASSIUM-HCTZ 100-25 MG PO TABS
1.0000 | ORAL_TABLET | Freq: Every day | ORAL | 2 refills | Status: DC
Start: 2023-02-12 — End: 2023-12-23

## 2023-02-12 MED ORDER — TOUJEO MAX SOLOSTAR 300 UNIT/ML ~~LOC~~ SOPN: 100.0000 [IU] | PEN_INJECTOR | Freq: Every day | SUBCUTANEOUS | 3 refills | Status: DC

## 2023-02-12 MED ORDER — GEMFIBROZIL 600 MG PO TABS: ORAL_TABLET | ORAL | 0 refills | Status: DC

## 2023-02-12 MED ORDER — ONETOUCH VERIO FLEX SYSTEM W/DEVICE KIT: PACK | 0 refills | Status: AC

## 2023-02-12 MED ORDER — SEMAGLUTIDE (1 MG/DOSE) 4 MG/3ML ~~LOC~~ SOPN: 1.0000 mg | PEN_INJECTOR | SUBCUTANEOUS | 2 refills | Status: DC

## 2023-02-12 MED ORDER — AMLODIPINE BESYLATE 10 MG PO TABS: 10.0000 mg | ORAL_TABLET | Freq: Every day | ORAL | 3 refills | Status: DC

## 2023-02-12 MED ORDER — ONETOUCH DELICA PLUS LANCET33G MISC: 1.0000 | Freq: Two times a day (BID) | 5 refills | Status: DC

## 2023-02-12 NOTE — Patient Instructions (Signed)
Please leave the urine sample in the lab. Rx sent to pharmacy.

## 2023-02-12 NOTE — Assessment & Plan Note (Signed)
Lab Results  Component Value Date   CHOL 153 10/26/2022   HDL 53 10/26/2022   LDLCALC 86 10/26/2022   LDLDIRECT 191.3 04/10/2011   TRIG 71 10/26/2022   CHOLHDL 2.9 10/26/2022  Continue gemfibrozil and Lipitor daily

## 2023-02-12 NOTE — Progress Notes (Signed)
Established Patient Office Visit  Subjective:  Patient ID: Travis Scott, male    DOB: Jan 25, 1981  Age: 42 y.o. MRN: [id number]  CC:  Chief Complaint  Patient presents with   Medical Management of Chronic Issues    HPI  Travis Scott presents for chronic disease management.  He has history of diabetes, hypertension, and hyperlipidemia.   His fasting BS 100-110.  He walks 2 miles every other day and do weightlifting.  States eating better labs.  Eye examination on April 2024.  His previous hemoglobin A1c 8.0 on 10/26/2022.  He states that the callus on both the big toe bilaterally is doing better.   HPI   Past Medical History:  Diagnosis Date   Chronic back pain    Diabetes mellitus    Hypertension    Kidney stones     Past Surgical History:  Procedure Laterality Date   ear tubes with butterfly clip put in Right    for fluid drainage    Family History  Problem Relation Age of Onset   Hypertension Mother    Diabetes Mother    Diabetes Father    Hypertension Father    Gout Father    Healthy Sister    Breast cancer Maternal Grandmother    Diabetes Maternal Grandfather    Hypertension Maternal Grandfather    Cancer Paternal Grandmother        not sure of the type   Stroke Paternal Grandmother 62   Diabetes Paternal Grandfather     Social History   Socioeconomic History   Marital status: Married    Spouse name: Sinquetta   Number of children: 3   Years of education: masters degree   Highest education level: Not on file  Occupational History    Employer: [employer]  Tobacco Use   Smoking status: Some Days    Types: Cigars   Smokeless tobacco: Never   Tobacco comments:    maybe 2 times in 6 months period  Vaping Use   Vaping status: Never Used  Substance and Sexual Activity   Alcohol use: Not Currently    Comment: once every few months   Drug use: No   Sexual activity: Yes    Birth control/protection: None  Other Topics Concern   Not on  file  Social History Narrative   11/30/19   From: [employer] and came to [facility] [employer]    Living: with Sinquetta (2006) and children   Work: Investment banker, 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      Family: 3 children [name] (2008), [name] (2010), [name] (2015)       Enjoys: get outside, travel, race track, beach/mountains, listen to music, coloring books      Exercise: walking - 3 times a week 3-5 miles    Diet: trying to follow diabetic diet      Safety   Seat belts: Yes    Guns: Yes  and secure   Safe in relationships: Yes    Social Determinants of Health   Financial Resource Strain: Not on file  Food Insecurity: Not on file  Transportation Needs: Not on file  Physical Activity: Not on file  Stress: Not on file  Social Connections: Not on file  Intimate Partner Violence: Not on file     Outpatient Medications Prior to Visit  Medication Sig Dispense Refill   Continuous Blood Gluc Sensor (FREESTYLE LIBRE 3 SENSOR) MISC Apply sensor every 14 days. 2 each 3  diclofenac (VOLTAREN) 50 MG EC tablet Take 50 mg by mouth 2 (two) times daily as needed.     Insulin Pen Needle 32G X 4 MM MISC 1 each by Does not apply route daily. 100 each 3   amLODipine (NORVASC) 10 MG tablet Take 1 tablet (10 mg total) by mouth daily. 90 tablet 3   atorvastatin (LIPITOR) 20 MG tablet Take 1 tablet (20 mg total) by mouth daily. 90 tablet 1   Blood Glucose Monitoring Suppl (ONETOUCH VERIO FLEX SYSTEM) w/Device KIT USE UP TO FOUR TIMES DAILY AS DIRECTED E11.9 1 kit 0   cyclobenzaprine (FLEXERIL) 10 MG tablet Take 10 mg by mouth 2 (two) times daily as needed.     gemfibrozil (LOPID) 600 MG tablet TAKE 1 TABLET(600 MG) BY MOUTH TWICE DAILY 180 tablet 0   glyBURIDE (DIABETA) 1.25 MG tablet Take 1 tablet (1.25 mg total) by mouth every morning. 90 tablet 0   insulin glargine, 2 Unit Dial, (TOUJEO MAX SOLOSTAR) 300 UNIT/ML Solostar Pen Inject 100 Units into the skin at bedtime. 9 mL 3   Lancets  (ONETOUCH DELICA PLUS LANCET33G) MISC Apply topically.     losartan-hydrochlorothiazide (HYZAAR) 100-25 MG tablet Take 1 tablet by mouth daily. 90 tablet 2   metFORMIN (GLUCOPHAGE-XR) 750 MG 24 hr tablet Take 1 tablet (750 mg total) by mouth daily with breakfast. 90 tablet 1   metoprolol succinate (TOPROL-XL) 100 MG 24 hr tablet Take 1 tablet (100 mg total) by mouth daily. Take with or immediately following a meal. 90 tablet 1   ONETOUCH VERIO test strip USE UP TO FOUR TIMES DAILY AS DIRECTED 100 strip 3   OZEMPIC, 0.25 OR 0.5 MG/DOSE, 2 MG/3ML SOPN INJECT 0.5 MG INTO SKIN ONCE A WEEK 3 mL 0   No facility-administered medications prior to visit.    No Known Allergies  ROS Review of Systems Negative unless indicated in HPI.    Objective:    Physical Exam Constitutional:      Appearance: Normal appearance. He is normal weight.  HENT:     Head: Normocephalic.     Right Ear: A PE tube is present.     Left Ear: Tympanic membrane normal.     Nose: Nose normal. No congestion.     Mouth/Throat:     Mouth: Mucous membranes are moist.     Pharynx: Oropharynx is clear.  Eyes:     Conjunctiva/sclera: Conjunctivae normal.     Pupils: Pupils are equal, round, and reactive to light.  Cardiovascular:     Rate and Rhythm: Normal rate and regular rhythm.     Pulses: Normal pulses.     Heart sounds: Normal heart sounds.  Pulmonary:     Effort: Pulmonary effort is normal.     Breath sounds: Normal breath sounds. No stridor. No wheezing.  Abdominal:     General: Bowel sounds are normal.     Palpations: Abdomen is soft.  Musculoskeletal:     Cervical back: Normal range of motion. No tenderness.     Comments: Callus formation on the big toe bilaterally  Skin:    General: Skin is warm.     Findings: No bruising.  Neurological:     General: No focal deficit present.     Mental Status: He is alert and oriented to person, place, and time. Mental status is at baseline.  Psychiatric:         Mood and Affect: Mood normal.  Behavior: Behavior normal.        Thought Content: Thought content normal.        Judgment: Judgment normal.     BP 126/76   Pulse 77   Temp 98.4 F (36.9 C) (Oral)   Ht 6' (1.829 m)   Wt 248 lb 12.8 oz (112.9 kg)   SpO2 99%   BMI 33.74 kg/m  Wt Readings from Last 3 Encounters:  02/12/23 248 lb 12.8 oz (112.9 kg)  10/26/22 247 lb 9.6 oz (112.3 kg)  01/26/22 248 lb 4 oz (112.6 kg)     Health Maintenance  Topic Date Due   Diabetic kidney evaluation - Urine ACR  10/13/2019   OPHTHALMOLOGY EXAM  10/27/2021   COVID-19 Vaccine (3 - 2023-24 season) 03/16/2022   FOOT EXAM  01/27/2023   INFLUENZA VACCINE  02/14/2023   HEMOGLOBIN A1C  08/15/2023   Diabetic kidney evaluation - eGFR measurement  10/26/2023   DTaP/Tdap/Td (2 - Td or Tdap) 06/30/2028   Hepatitis C Screening  Completed   HIV Screening  Completed   HPV VACCINES  Aged Out    There are no preventive care reminders to display for this patient.  Lab Results  Component Value Date   TSH 0.720 10/26/2022   Lab Results  Component Value Date   WBC 5.8 10/26/2022   HGB 13.2 10/26/2022   HCT 38.7 10/26/2022   MCV 86 10/26/2022   PLT 239 10/26/2022   Lab Results  Component Value Date   NA 141 10/26/2022   K 4.7 10/26/2022   CO2 22 10/26/2022   GLUCOSE 78 10/26/2022   BUN 10 10/26/2022   CREATININE 1.15 10/26/2022   BILITOT 0.4 10/26/2022   ALKPHOS 65 10/26/2022   AST 39 10/26/2022   ALT 27 10/26/2022   PROT 7.0 10/26/2022   ALBUMIN 4.4 10/26/2022   CALCIUM 9.7 10/26/2022   ANIONGAP 12 06/29/2013   EGFR 81 10/26/2022   GFR 75.00 01/26/2022   Lab Results  Component Value Date   CHOL 153 10/26/2022   Lab Results  Component Value Date   HDL 53 10/26/2022   Lab Results  Component Value Date   LDLCALC 86 10/26/2022   Lab Results  Component Value Date   TRIG 71 10/26/2022   Lab Results  Component Value Date   CHOLHDL 2.9 10/26/2022   Lab Results  Component  Value Date   HGBA1C 6.7 (A) 02/12/2023      Assessment & Plan:  Essential hypertension Assessment & Plan: Patient BP stable in the office today Advised pt to follow a low sodium and heart healthy diet. Continue losartan/HCTZ, metoprolol and amlodipine. Will continue to monitor.  Orders: -     amLODIPine Besylate; Take 1 tablet (10 mg total) by mouth daily.  Dispense: 90 tablet; Refill: 3 -     Losartan Potassium-HCTZ; Take 1 tablet by mouth daily.  Dispense: 90 tablet; Refill: 2 -     CBC with Differential/Platelet; Future -     Comprehensive metabolic panel; Future  Type 2 diabetes mellitus with hyperglycemia, with long-term current use of insulin (HCC) Assessment & Plan: POCT hemoglobin A1c 6.7  today. Continue regular exercise and diet management. Continue current medication regimen.   Orders: -     POCT glycosylated hemoglobin (Hb A1C) -     Microalbumin / creatinine urine ratio -     Hemoglobin A1c; Future  Foot callus Assessment & Plan: Improved compared to last visit. Discussed with the patient let us  know if needs dermatology referral   Hyperlipidemia associated with type 2 diabetes mellitus [facility]) Assessment & Plan: Lab Results  Component Value Date   CHOL 153 10/26/2022   HDL 53 10/26/2022   LDLCALC 86 10/26/2022   LDLDIRECT 191.3 04/10/2011   TRIG 71 10/26/2022   CHOLHDL 2.9 10/26/2022  Continue gemfibrozil and Lipitor daily   Orders: -     Lipid panel; Future  Other orders -     Atorvastatin Calcium; Take 1 tablet (20 mg total) by mouth daily.  Dispense: 90 tablet; Refill: 1 -     OneTouch Verio Flex System; USE UP TO FOUR TIMES DAILY AS DIRECTED E11.9  Dispense: 1 kit; Refill: 0 -     Gemfibrozil; TAKE 1 TABLET(600 MG) BY MOUTH TWICE DAILY  Dispense: 180 tablet; Refill: 0 -     glyBURIDE; Take 1 tablet (1.25 mg total) by mouth every morning.  Dispense: 90 tablet; Refill: 0 -     OneTouch Delica Plus Lancet33G; Apply 1 each topically 2 (two) times  daily.  Dispense: 1 each; Refill: 5 -     Toujeo Max SoloStar; Inject 100 Units into the skin at bedtime.  Dispense: 9 mL; Refill: 3 -     metFORMIN HCl ER; Take 1 tablet (750 mg total) by mouth daily with breakfast.  Dispense: 90 tablet; Refill: 1 -     Metoprolol Succinate ER; Take 1 tablet (100 mg total) by mouth daily. Take with or immediately following a meal.  Dispense: 90 tablet; Refill: 1 -     OneTouch Verio; Use as instructed  Dispense: 100 strip; Refill: 3 -     Semaglutide (1 MG/DOSE); Inject 1 mg as directed once a week.  Dispense: 3 mL; Refill: 2    Follow-up: Return in about 6 months (around 08/15/2023) for follow up with fasting lab 2 days prior.   [name], NP

## 2023-02-12 NOTE — Assessment & Plan Note (Signed)
Improved compared to last visit. Discussed with the patient let us know if needs dermatology referral

## 2023-02-12 NOTE — Assessment & Plan Note (Signed)
Patient BP stable in the office today Advised pt to follow a low sodium and heart healthy diet. Continue losartan/HCTZ, metoprolol and amlodipine. Will continue to monitor.

## 2023-02-12 NOTE — Telephone Encounter (Signed)
Twice a day

## 2023-02-12 NOTE — Assessment & Plan Note (Signed)
POCT hemoglobin A1c 6.7  today. Continue regular exercise and diet management. Continue current medication regimen.

## 2023-02-12 NOTE — Telephone Encounter (Signed)
[facility] called stating Travis Scott sent in test strips for pt and they want to know how often the pt is testing

## 2023-02-13 MED ORDER — ONETOUCH VERIO VI STRP: ORAL_STRIP | 3 refills | Status: AC

## 2023-02-13 MED ORDER — ONETOUCH DELICA PLUS LANCET33G MISC: 1.0000 | Freq: Two times a day (BID) | 5 refills | Status: AC

## 2023-02-13 NOTE — Telephone Encounter (Signed)
Left instructions on pharmacy voicemail & resent Rx. This information is always needed for test strips & lancets.

## 2023-04-08 ENCOUNTER — Ambulatory Visit
Admission: EM | Admit: 2023-04-08 | Discharge: 2023-04-08 | Disposition: A | Discharge status: Home / Self Care | Payer: 59 | Attending: Emergency Medicine | Admitting: Emergency Medicine

## 2023-04-08 ENCOUNTER — Encounter: Payer: Self-pay | Admitting: Emergency Medicine

## 2023-04-08 DIAGNOSIS — J069 Acute upper respiratory infection, unspecified: Secondary | ICD-10-CM | POA: Diagnosis not present

## 2023-04-08 DIAGNOSIS — U071 COVID-19: Secondary | ICD-10-CM | POA: Diagnosis not present

## 2023-04-08 MED ORDER — PROMETHAZINE-DM 6.25-15 MG/5ML PO SYRP: 5.0000 mL | ORAL_SOLUTION | Freq: Four times a day (QID) | ORAL | 0 refills | Status: AC | PRN

## 2023-04-08 MED ORDER — BENZONATATE 100 MG PO CAPS: 100.0000 mg | ORAL_CAPSULE | Freq: Three times a day (TID) | ORAL | 0 refills | Status: AC

## 2023-04-08 NOTE — ED Triage Notes (Signed)
Pt reports a fever x 2 days. His temperature goes down with medication. He has a cough and upset stomach.

## 2023-04-08 NOTE — Discharge Instructions (Addendum)
Your symptoms today are most likely being caused by a virus and should steadily improve in time it can take up to 7 to 10 days before you truly start to see a turnaround however things will get better  COVID test is pending up to 24 hours, you will be notified of positive test results only, if positive you will need to quarantine until without fever for 24 hours, if no fever you may continue activity wearing mask until symptoms resolve  May use Tessalon pill every 8 hours as needed for cough  You may use cough syrup every 6 hours as needed for additional comfort, be mindful this medicine will make you feel sleepy, this medicine has additional medicine that helps with nausea and vomiting, this may help to also settle your stomach, if using throughout the day, be mindful of drowsy effect    You can take Tylenol and/or Ibuprofen as needed for fever reduction and pain relief.   For cough: honey 1/2 to 1 teaspoon (you can dilute the honey in water or another fluid).  You can also use guaifenesin and dextromethorphan for cough. You can use a humidifier for chest congestion and cough.  If you don't have a humidifier, you can sit in the bathroom with the hot shower running.      For sore throat: try warm salt water gargles, cepacol lozenges, throat spray, warm tea or water with lemon/honey, popsicles or ice, or OTC cold relief medicine for throat discomfort.   For congestion: take a daily anti-histamine like Zyrtec, Claritin, and a oral decongestant, such as pseudoephedrine.  You can also use Flonase 1-2 sprays in each nostril daily.   It is important to stay hydrated: drink plenty of fluids (water, gatorade/powerade/pedialyte, juices, or teas) to keep your throat moisturized and help further relieve irritation/discomfort. Julian Hy

## 2023-04-08 NOTE — ED Provider Notes (Signed)
Travis Scott    CSN: [id number] Arrival date & time: 04/08/23  1057      History   Chief Complaint Chief Complaint  Patient presents with   Fever    HPI Travis Scott is a 42 y.o. male.   Patient presents for evaluation of fever peaking at 100.1, mild nasal congestion, a productive cough and generalized uneasiness to the abdomen present for 2 days.  Has been using over-the-counter cold and flu mix which has been helpful in managing fever.  Decreased appetite but tolerating some food and liquids.  Known sick contact in household prior to symptoms beginning.  Denies shortness of breath and wheezing.  Past Medical History:  Diagnosis Date   Chronic back pain    Diabetes mellitus    Hypertension    Kidney stones     Patient Active Problem List   Diagnosis Date Noted   Foot callus 11/05/2022   Plantar wart 01/26/2022   Right ear pain 09/27/2021   Right-sided headache 09/27/2021   Diabetic retinopathy associated with type 2 diabetes mellitus (HCC) 11/24/2020   Achilles tendinitis of right lower extremity 11/30/2019   Obesity 04/15/2014   Tobacco abuse 09/07/2013   Diabetic neuropathy (HCC) 08/14/2012   Low testosterone 08/14/2012   Erectile dysfunction 08/14/2012   Diabetes mellitus (HCC) 12/06/2011   Fatigue 04/10/2011   Hyperlipidemia associated with type 2 diabetes mellitus (HCC) 04/10/2011   Essential hypertension    Esophageal reflux 07/25/2010    Past Surgical History:  Procedure Laterality Date   ear tubes with butterfly clip put in Right    for fluid drainage       Home Medications    Prior to Admission medications   Medication Sig Start Date End Date Taking? Authorizing Provider  benzonatate (TESSALON) 100 MG capsule Take 1 capsule (100 mg total) by mouth every 8 (eight) hours. 04/08/23  Yes Serena Petterson R, NP  promethazine-dextromethorphan (PROMETHAZINE-DM) 6.25-15 MG/5ML syrup Take 5 mLs by mouth 4 (four) times daily as needed for  cough. 04/08/23  Yes [name] R, NP  amLODipine (NORVASC) 10 MG tablet Take 1 tablet (10 mg total) by mouth daily. 02/12/23   [name], NP  atorvastatin (LIPITOR) 20 MG tablet Take 1 tablet (20 mg total) by mouth daily. 02/12/23   [name], NP  Blood Glucose Monitoring Suppl (ONETOUCH VERIO FLEX SYSTEM) w/Device KIT USE UP TO FOUR TIMES DAILY AS DIRECTED E11.9 02/12/23   [name], NP  Continuous Blood Gluc Sensor (FREESTYLE LIBRE 3 SENSOR) MISC Apply sensor every 14 days. 04/13/22   [name], MD  diclofenac (VOLTAREN) 50 MG EC tablet Take 50 mg by mouth 2 (two) times daily as needed. 01/24/21   [provider]  gemfibrozil (LOPID) 600 MG tablet TAKE 1 TABLET(600 MG) BY MOUTH TWICE DAILY 02/12/23   [name], NP  glucose blood (ONETOUCH VERIO) test strip Use twice a day as instructed 02/13/23   [name], NP  glyBURIDE (DIABETA) 1.25 MG tablet Take 1 tablet (1.25 mg total) by mouth every morning. 02/12/23   [name], NP  insulin glargine, 2 Unit Dial, (TOUJEO MAX SOLOSTAR) 300 UNIT/ML Solostar Pen Inject 100 Units into the skin at bedtime. 02/12/23   [name], NP  Insulin Pen Needle 32G X 4 MM MISC 1 each by Does not apply route daily. 04/16/22   [name], MD  Lancets [facility] DELICA PLUS [place]) MISC Apply 1 each topically 2 (two) times daily. 02/13/23   [name],  NP  losartan-hydrochlorothiazide (HYZAAR) 100-25 MG tablet Take 1 tablet by mouth daily. 02/12/23   [name], NP  metFORMIN (GLUCOPHAGE-XR) 750 MG 24 hr tablet Take 1 tablet (750 mg total) by mouth daily with breakfast. 02/12/23   [name], NP  metoprolol succinate (TOPROL-XL) 100 MG 24 hr tablet Take 1 tablet (100 mg total) by mouth daily. Take with or immediately following a meal. 02/12/23   [name], NP  Semaglutide, 1 MG/DOSE, 4 MG/3ML SOPN Inject 1 mg as directed once a week. 02/12/23   [name], NP    Family  History Family History  Problem Relation Age of Onset   Hypertension Mother    Diabetes Mother    Diabetes Father    Hypertension Father    Gout Father    Healthy Sister    Breast cancer Maternal Grandmother    Diabetes Maternal Grandfather    Hypertension Maternal Grandfather    Cancer Paternal Grandmother        not sure of the type   Stroke Paternal Grandmother 3   Diabetes Paternal Grandfather     Social History Social History   Tobacco Use   Smoking status: Former    Types: Cigars   Smokeless tobacco: Never   Tobacco comments:    maybe 2 times in 6 months period  Vaping Use   Vaping status: Never Used  Substance Use Topics   Alcohol use: Not Currently    Comment: once every few months   Drug use: No     Allergies   Patient has no known allergies.   Review of Systems Review of Systems  Constitutional:  Positive for fever.     Physical Exam Triage Vital Signs ED Triage Vitals  Encounter Vitals Group     BP 04/08/23 1155 126/84     Systolic BP Percentile --      Diastolic BP Percentile --      Pulse Rate 04/08/23 1155 80     Resp 04/08/23 1155 18     Temp 04/08/23 1155 98.9 F (37.2 C)     Temp Source 04/08/23 1155 Oral     SpO2 04/08/23 1155 98 %     Weight --      Height --      Head Circumference --      Peak Flow --      Pain Score 04/08/23 1154 0     Pain Loc --      Pain Education --      Exclude from Growth Chart --    No data found.  Updated Vital Signs BP 126/84   Pulse 80   Temp 98.9 F (37.2 C) (Oral)   Resp 18   SpO2 98%   Visual Acuity Right Eye Distance:   Left Eye Distance:   Bilateral Distance:    Right Eye Near:   Left Eye Near:    Bilateral Near:     Physical Exam Constitutional:      Appearance: Normal appearance.  HENT:     Head: Normocephalic.     Right Ear: Tympanic membrane, ear canal and external ear normal.     Left Ear: Tympanic membrane, ear canal and external ear normal.     Nose: Congestion  present. No rhinorrhea.     Mouth/Throat:     Mouth: Mucous membranes are moist.     Pharynx: Oropharynx is clear. No posterior oropharyngeal erythema.  Eyes:     Extraocular Movements: Extraocular movements intact.  Cardiovascular:     Rate and Rhythm: Normal rate and regular rhythm.     Pulses: Normal pulses.     Heart sounds: Normal heart sounds.  Pulmonary:     Effort: Pulmonary effort is normal.     Breath sounds: Normal breath sounds.  Skin:    General: Skin is warm and dry.  Neurological:     Mental Status: He is alert and oriented to person, place, and time. Mental status is at baseline.      UC Treatments / Results  Labs (all labs ordered are listed, but only abnormal results are displayed) Labs Reviewed  SARS CORONAVIRUS 2 (TAT 6-24 HRS)    EKG   Radiology No results found.  Procedures Procedures (including critical care time)  Medications Ordered in UC Medications - No data to display  Initial Impression / Assessment and Plan / UC Course  I have reviewed the triage vital signs and the nursing notes.  Pertinent labs & imaging results that were available during my care of the patient were reviewed by me and considered in my medical decision making (see chart for details).  Viral URI with cough  Patient is in no signs of distress nor toxic appearing.  Vital signs are stable.  Low suspicion for pneumonia, pneumothorax or bronchitis and therefore will defer imaging. COVID test is pending, reviewed quarantine guidelines per CDC recommendations will qualify for antivirals if positive, prescribed Tessalon and Promethazine DM. May use additional over-the-counter medications as needed for supportive care.  May follow-up with urgent care as needed if symptoms persist or worsen.  Final Clinical Impressions(s) / UC Diagnoses   Final diagnoses:  Viral URI with cough     Discharge Instructions      Your symptoms today are most likely being caused by a virus and  should steadily improve in time it can take up to 7 to 10 days before you truly start to see a turnaround however things will get better  COVID test is pending up to 24 hours, you will be notified of positive test results only, if positive you will need to quarantine until without fever for 24 hours, if no fever you may continue activity wearing mask until symptoms resolve  May use Tessalon pill every 8 hours as needed for cough  You may use cough syrup every 6 hours as needed for additional comfort, be mindful this medicine will make you feel sleepy, this medicine has additional medicine that helps with nausea and vomiting, this may help to also settle your stomach, if using throughout the day, be mindful of drowsy effect    You can take Tylenol and/or Ibuprofen as needed for fever reduction and pain relief.   For cough: honey 1/2 to 1 teaspoon (you can dilute the honey in water or another fluid).  You can also use guaifenesin and dextromethorphan for cough. You can use a humidifier for chest congestion and cough.  If you don't have a humidifier, you can sit in the bathroom with the hot shower running.      For sore throat: try warm salt water gargles, cepacol lozenges, throat spray, warm tea or water with lemon/honey, popsicles or ice, or OTC cold relief medicine for throat discomfort.   For congestion: take a daily anti-histamine like Zyrtec, Claritin, and a oral decongestant, such as pseudoephedrine.  You can also use Flonase 1-2 sprays in each nostril daily.   It is important to stay hydrated: drink plenty of fluids (water, gatorade/powerade/pedialyte, juices, or  teas) to keep your throat moisturized and help further relieve irritation/discomfort. tessa   ED Prescriptions     Medication Sig Dispense Auth. Provider   benzonatate (TESSALON) 100 MG capsule Take 1 capsule (100 mg total) by mouth every 8 (eight) hours. 21 capsule Sheranda Seabrooks R, NP   promethazine-dextromethorphan  (PROMETHAZINE-DM) 6.25-15 MG/5ML syrup Take 5 mLs by mouth 4 (four) times daily as needed for cough. 118 mL Poetry Cerro, Elita Boone, NP      PDMP not reviewed this encounter.   Valinda Hoar, NP 04/08/23 1401

## 2023-04-09 ENCOUNTER — Telehealth: Payer: Self-pay

## 2023-04-09 LAB — SARS CORONAVIRUS 2 (TAT 6-24 HRS): SARS Coronavirus 2: POSITIVE — AB

## 2023-04-09 MED ORDER — PAXLOVID (300/100) 20 X 150 MG & 10 X 100MG PO TBPK: 3.0000 | ORAL_TABLET | Freq: Two times a day (BID) | ORAL | 0 refills | Status: AC

## 2023-04-09 NOTE — Telephone Encounter (Signed)
Per provider note, pt qualifies for anti-virals. Paxlovid normal dosing ok per Helaine Chess,  PA-C.  Reviewed with patient; informed to hold Lipitor. Verified pharmacy, prescription sent.

## 2023-06-03 ENCOUNTER — Other Ambulatory Visit: Payer: Self-pay | Admitting: Nurse Practitioner

## 2023-07-24 ENCOUNTER — Other Ambulatory Visit: Payer: Self-pay | Admitting: Nurse Practitioner

## 2023-07-29 ENCOUNTER — Encounter: Payer: Self-pay | Admitting: Nurse Practitioner

## 2023-07-29 MED ORDER — SEMAGLUTIDE (1 MG/DOSE) 4 MG/3ML ~~LOC~~ SOPN: 1.0000 mg | PEN_INJECTOR | SUBCUTANEOUS | 2 refills | Status: DC

## 2023-07-30 ENCOUNTER — Telehealth: Payer: Self-pay

## 2023-07-30 NOTE — Telephone Encounter (Signed)
 [facility] states this Patient needs a PA for the Ozempic 1 mg.

## 2023-07-31 NOTE — Telephone Encounter (Signed)
 Called [facility] and was told the Patient needs a PA for the Ozempic .

## 2023-08-02 ENCOUNTER — Telehealth: Payer: Self-pay

## 2023-08-02 ENCOUNTER — Other Ambulatory Visit (HOSPITAL_COMMUNITY): Payer: Self-pay

## 2023-08-02 NOTE — Telephone Encounter (Signed)
Pharmacy Patient Advocate Encounter   Received notification from  [facility] Portal that prior authorization for Ozempic (1 MG/DOSE) 4MG /3ML pen-injectors is required/requested.   Insurance verification completed.   The patient is insured through [facility] .   Per test claim: PA required; PA submitted to above mentioned insurance via CoverMyMeds Key/confirmation #/EOC  [facility] Status is pending

## 2023-08-05 ENCOUNTER — Other Ambulatory Visit (HOSPITAL_COMMUNITY): Payer: Self-pay

## 2023-08-05 NOTE — Telephone Encounter (Signed)
Attempted to call Patient no answer & voicemail was full.

## 2023-08-05 NOTE — Telephone Encounter (Signed)
Pharmacy Patient Advocate Encounter  Received notification from [facility] that Prior Authorization for Ozempic (1 MG/DOSE) 4MG /3ML pen-injectors has been APPROVED from 08/02/23 to 08/01/24. Ran test claim, Copay is $194.13. This test claim was processed through [facility]- copay amounts may vary at other pharmacies due to pharmacy/plan contracts, or as the patient moves through the different stages of their insurance plan.   PA #/Case ID/Reference #: [id number]

## 2023-08-05 NOTE — Telephone Encounter (Signed)
This has been documented and approved in separate encounter, please see encounter dated 08/02/2023 for further details, thank you.

## 2023-08-12 ENCOUNTER — Other Ambulatory Visit: Payer: 59

## 2023-08-15 ENCOUNTER — Ambulatory Visit: Payer: 59 | Admitting: Nurse Practitioner

## 2023-09-03 ENCOUNTER — Encounter: Payer: Self-pay | Admitting: Nurse Practitioner

## 2023-09-03 ENCOUNTER — Ambulatory Visit (INDEPENDENT_AMBULATORY_CARE_PROVIDER_SITE_OTHER): Payer: No Typology Code available for payment source | Admitting: Nurse Practitioner

## 2023-09-03 VITALS — BP 124/70 | HR 88 | Temp 98.7°F | Ht 72.0 in | Wt 246.6 lb

## 2023-09-03 DIAGNOSIS — Z794 Long term (current) use of insulin: Secondary | ICD-10-CM

## 2023-09-03 DIAGNOSIS — E1169 Type 2 diabetes mellitus with other specified complication: Secondary | ICD-10-CM | POA: Diagnosis not present

## 2023-09-03 DIAGNOSIS — E785 Hyperlipidemia, unspecified: Secondary | ICD-10-CM

## 2023-09-03 DIAGNOSIS — M79601 Pain in right arm: Secondary | ICD-10-CM | POA: Diagnosis not present

## 2023-09-03 DIAGNOSIS — I10 Essential (primary) hypertension: Secondary | ICD-10-CM | POA: Diagnosis not present

## 2023-09-03 NOTE — Progress Notes (Signed)
 Established Patient Office Visit  Subjective:  Patient ID: Travis Scott, male    DOB: 1981/02/05  Age: 43 y.o. MRN: [id number]  CC:  Chief Complaint  Patient presents with   Medical Management of Chronic Issues   Discussed the use of a AI scribe software for clinical note transcription with the patient, who gave verbal consent to proceed.  HPI  Travis Scott presents for routine follow up.  History of hypertension, hyperlipidemia, and GERD  He has been experiencing recurrent discomfort in his right arm, initially noticed in 2022 , possibly due to a muscle strain. The discomfort resolved but recurred two to three weeks ago. He describes it as a sensation of weakness and reduced strength, particularly during activities like lifting weights or performing a shoulder press. A pulling sensation is felt when lifting or pushing, but it does not cause significant pain. The discomfort is persistent, even when the arm is not in use, and feels tender to touch. He has not used any medications or ointments for relief and has not identified any specific activity that might have triggered the recurrence.  His diabetes is managed with Ozempic, Toujeo, and metformin. He monitors his blood sugar levels at home, with fasting levels typically between 110 to 120 mg/dL and postprandial levels between 130 to 140 mg/dL. He has experienced hypoglycemic episodes, with the lowest recorded level being 67 mg/dL, which he can feel as 'shaking' and a need to eat.  He has a history of hypertension, managed with amlodipine, losartan hydrochlorothiazide, and metoprolol. His blood pressure was recorded at 124/70 mmHg during the visit.  He is on atorvastatin and fenofibrate for cholesterol management.  He reports long-standing gastrointestinal issues, including occasional nausea, constipation, and diarrhea, which he attributes to dietary factors. He manages these symptoms by increasing fiber intake and drinking more  water.  He has had a tympanostomy tube in place for approximately ten years due to fluid buildup and associated severe headaches and migraines on the right side of his face.  HPI   Past Medical History:  Diagnosis Date   Chronic back pain    Diabetes mellitus    Hypertension    Kidney stones     Past Surgical History:  Procedure Laterality Date   ear tubes with butterfly clip put in Right    for fluid drainage    Family History  Problem Relation Age of Onset   Hypertension Mother    Diabetes Mother    Diabetes Father    Hypertension Father    Gout Father    Healthy Sister    Cancer Maternal Grandmother        colon cancer   Breast cancer Maternal Grandmother    Diabetes Maternal Grandfather    Hypertension Maternal Grandfather    Cancer Paternal Grandmother        not sure of the type   Stroke Paternal Grandmother 64   Diabetes Paternal Grandfather     Social History   Socioeconomic History   Marital status: Married    Spouse name: Sinquetta   Number of children: 3   Years of education: masters degree   Highest education level: Not on file  Occupational History    Employer: [employer]  Tobacco Use   Smoking status: Some Days    Types: Cigars   Smokeless tobacco: Never   Tobacco comments:    maybe 2 times in 6 months period  Vaping Use   Vaping status: Never Used  Substance  and Sexual Activity   Alcohol use: Not Currently    Comment: once every few months   Drug use: No   Sexual activity: Yes    Birth control/protection: None  Other Topics Concern   Not on file  Social History Narrative   11/30/19   From: [place] and came to [facility] [place]    Living: with Sinquetta (2006) and children   Work: Investment banker, 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      Family: 3 children [name] (2008), [name] (2010), [name] (2015)       Enjoys: get outside, travel, race track, beach/mountains, listen to music, coloring books      Exercise: walking -  3 times a week 3-5 miles    Diet: trying to follow diabetic diet      Safety   Seat belts: Yes    Guns: Yes  and secure   Safe in relationships: Yes    Social Drivers of Corporate investment banker Strain: Not on file  Food Insecurity: Not on file  Transportation Needs: Not on file  Physical Activity: Not on file  Stress: Not on file  Social Connections: Not on file  Intimate Partner Violence: Not on file     Outpatient Medications Prior to Visit  Medication Sig Dispense Refill   amLODipine (NORVASC) 10 MG tablet Take 1 tablet (10 mg total) by mouth daily. 90 tablet 3   atorvastatin (LIPITOR) 20 MG tablet Take 1 tablet (20 mg total) by mouth daily. 90 tablet 1   benzonatate (TESSALON) 100 MG capsule Take 1 capsule (100 mg total) by mouth every 8 (eight) hours. 21 capsule 0   Blood Glucose Monitoring Suppl (ONETOUCH VERIO FLEX SYSTEM) w/Device KIT USE UP TO FOUR TIMES DAILY AS DIRECTED E11.9 1 kit 0   Continuous Blood Gluc Sensor (FREESTYLE LIBRE 3 SENSOR) MISC Apply sensor every 14 days. 2 each 3   diclofenac (VOLTAREN) 50 MG EC tablet Take 50 mg by mouth 2 (two) times daily as needed.     gemfibrozil (LOPID) 600 MG tablet TAKE 1 TABLET(600 MG) BY MOUTH TWICE DAILY 180 tablet 0   glucose blood (ONETOUCH VERIO) test strip Use twice a day as instructed 200 strip 3   glyBURIDE (DIABETA) 1.25 MG tablet TAKE 1 TABLET(1.25 MG) BY MOUTH EVERY MORNING 90 tablet 0   insulin glargine, 2 Unit Dial, (TOUJEO MAX SOLOSTAR) 300 UNIT/ML Solostar Pen Inject 100 Units into the skin at bedtime. 9 mL 3   Insulin Pen Needle 32G X 4 MM MISC 1 each by Does not apply route daily. 100 each 3   Lancets (ONETOUCH DELICA PLUS LANCET33G) MISC Apply 1 each topically 2 (two) times daily. 200 each 5   losartan-hydrochlorothiazide (HYZAAR) 100-25 MG tablet Take 1 tablet by mouth daily. 90 tablet 2   metFORMIN (GLUCOPHAGE-XR) 750 MG 24 hr tablet Take 1 tablet (750 mg total) by mouth daily with breakfast. 90 tablet  1   metoprolol succinate (TOPROL-XL) 100 MG 24 hr tablet Take 1 tablet (100 mg total) by mouth daily. Take with or immediately following a meal. 90 tablet 1   promethazine-dextromethorphan (PROMETHAZINE-DM) 6.25-15 MG/5ML syrup Take 5 mLs by mouth 4 (four) times daily as needed for cough. 118 mL 0   Semaglutide, 1 MG/DOSE, 4 MG/3ML SOPN Inject 1 mg as directed once a week. 3 mL 2   No facility-administered medications prior to visit.    No Known Allergies  ROS Review of Systems Negative unless  indicated in HPI.    Objective:    Physical Exam Constitutional:      Appearance: Normal appearance. He is normal weight.  HENT:     Head: Normocephalic.     Right Ear: A PE tube is present.     Left Ear: Tympanic membrane normal.     Nose: Nose normal. No congestion.     Mouth/Throat:     Mouth: Mucous membranes are moist.     Pharynx: Oropharynx is clear.  Eyes:     Conjunctiva/sclera: Conjunctivae normal.     Pupils: Pupils are equal, round, and reactive to light.  Cardiovascular:     Rate and Rhythm: Normal rate and regular rhythm.     Pulses: Normal pulses.     Heart sounds: Normal heart sounds.  Pulmonary:     Effort: Pulmonary effort is normal.     Breath sounds: Normal breath sounds. No stridor. No wheezing.  Abdominal:     General: Bowel sounds are normal.     Palpations: Abdomen is soft.  Musculoskeletal:        General: Normal range of motion.     Cervical back: Normal range of motion.  Skin:    General: Skin is warm.     Findings: No bruising.  Neurological:     General: No focal deficit present.     Mental Status: He is alert and oriented to person, place, and time. Mental status is at baseline.  Psychiatric:        Mood and Affect: Mood normal.        Behavior: Behavior normal.        Thought Content: Thought content normal.        Judgment: Judgment normal.     BP 124/70   Pulse 88   Temp 98.7 F (37.1 C)   Ht 6' (1.829 m)   Wt 246 lb 9.6 oz (111.9  kg)   SpO2 99%   BMI 33.44 kg/m  Wt Readings from Last 3 Encounters:  09/03/23 246 lb 9.6 oz (111.9 kg)  02/12/23 248 lb 12.8 oz (112.9 kg)  10/26/22 247 lb 9.6 oz (112.3 kg)     Health Maintenance  Topic Date Due   Pneumococcal Vaccine 27-21 Years old (2 of 2 - PPSV23 or PCV20) 02/22/2015   OPHTHALMOLOGY EXAM  10/27/2021   FOOT EXAM  01/27/2023   COVID-19 Vaccine (3 - 2024-25 season) 03/17/2023   HEMOGLOBIN A1C  08/15/2023   INFLUENZA VACCINE  10/14/2023 (Originally 02/14/2023)   Diabetic kidney evaluation - eGFR measurement  10/26/2023   Diabetic kidney evaluation - Urine ACR  02/12/2024   DTaP/Tdap/Td (2 - Td or Tdap) 06/30/2028   Hepatitis C Screening  Completed   HIV Screening  Completed   HPV VACCINES  Aged Out    There are no preventive care reminders to display for this patient.  Lab Results  Component Value Date   TSH 0.720 10/26/2022   Lab Results  Component Value Date   WBC 5.8 10/26/2022   HGB 13.2 10/26/2022   HCT 38.7 10/26/2022   MCV 86 10/26/2022   PLT 239 10/26/2022   Lab Results  Component Value Date   NA 141 10/26/2022   K 4.7 10/26/2022   CO2 22 10/26/2022   GLUCOSE 78 10/26/2022   BUN 10 10/26/2022   CREATININE 1.15 10/26/2022   BILITOT 0.4 10/26/2022   ALKPHOS 65 10/26/2022   AST 39 10/26/2022   ALT 27 10/26/2022   PROT 7.0  10/26/2022   ALBUMIN 4.4 10/26/2022   CALCIUM 9.7 10/26/2022   ANIONGAP 12 06/29/2013   EGFR 81 10/26/2022   GFR 75.00 01/26/2022   Lab Results  Component Value Date   CHOL 153 10/26/2022   Lab Results  Component Value Date   HDL 53 10/26/2022   Lab Results  Component Value Date   LDLCALC 86 10/26/2022   Lab Results  Component Value Date   TRIG 71 10/26/2022   Lab Results  Component Value Date   CHOLHDL 2.9 10/26/2022   Lab Results  Component Value Date   HGBA1C 6.7 (A) 02/12/2023      Assessment & Plan:  Hyperlipidemia associated with type 2 diabetes mellitus [facility]) Assessment & Plan: Lab  Results  Component Value Date   CHOL 153 10/26/2022   HDL 53 10/26/2022   LDLCALC 86 10/26/2022   LDLDIRECT 191.3 04/10/2011   TRIG 71 10/26/2022   CHOLHDL 2.9 10/26/2022   Lab Results  Component Value Date   CHOL 153 10/26/2022   HDL 53 10/26/2022   LDLCALC 86 10/26/2022   LDLDIRECT 191.3 04/10/2011   TRIG 71 10/26/2022   CHOLHDL 2.9 10/26/2022  Continue gemfibrozil and Lipitor daily. Will check lipid panel.   Orders: -     Lipid panel; Future  Right arm pain Assessment & Plan: Chronic discomfort in the right arm, exacerbated by weight lifting and certain movements. No acute pain, but a persistent feeling of weakness and tenderness. No recent trauma or changes in activity that could explain the recurrence of symptoms. -Refer to physical therapy for evaluation and treatment.  Orders: -     Ambulatory referral to Physical Therapy  Essential hypertension Assessment & Plan: Patient BP stable in the office today Advised pt to follow a low sodium and heart healthy diet. Continue losartan/HCTZ, metoprolol and amlodipine. Will continue to monitor.  Orders: -     CBC with Differential/Platelet; Future -     Comprehensive metabolic panel; Future -     TSH; Future  Type 2 diabetes mellitus with other specified complication, with long-term current use of insulin [facility]) Assessment & Plan: Lab Results  Component Value Date   HGBA1C 6.7 (A) 02/12/2023  Continue regular exercise and diet management. Continue current medication regimen. Will check Hga1c  Orders: -     Hemoglobin A1c; Future    Follow-up: Return in about 2 months (around 11/01/2023) for physical.   [name], NP

## 2023-09-09 DIAGNOSIS — M79601 Pain in right arm: Secondary | ICD-10-CM | POA: Insufficient documentation

## 2023-09-09 NOTE — Assessment & Plan Note (Signed)
 Patient BP stable in the office today Advised pt to follow a low sodium and heart healthy diet. Continue losartan/HCTZ, metoprolol and amlodipine. Will continue to monitor.

## 2023-09-09 NOTE — Assessment & Plan Note (Signed)
 Lab Results  Component Value Date   CHOL 153 10/26/2022   HDL 53 10/26/2022   LDLCALC 86 10/26/2022   LDLDIRECT 191.3 04/10/2011   TRIG 71 10/26/2022   CHOLHDL 2.9 10/26/2022   Lab Results  Component Value Date   CHOL 153 10/26/2022   HDL 53 10/26/2022   LDLCALC 86 10/26/2022   LDLDIRECT 191.3 04/10/2011   TRIG 71 10/26/2022   CHOLHDL 2.9 10/26/2022  Continue gemfibrozil and Lipitor daily. Will check lipid panel.

## 2023-09-09 NOTE — Assessment & Plan Note (Signed)
 Chronic discomfort in the right arm, exacerbated by weight lifting and certain movements. No acute pain, but a persistent feeling of weakness and tenderness. No recent trauma or changes in activity that could explain the recurrence of symptoms. -Refer to physical therapy for evaluation and treatment.

## 2023-09-09 NOTE — Assessment & Plan Note (Signed)
 Lab Results  Component Value Date   HGBA1C 6.7 (A) 02/12/2023  Continue regular exercise and diet management. Continue current medication regimen. Will check Hga1c

## 2023-09-12 ENCOUNTER — Other Ambulatory Visit: Payer: Self-pay | Admitting: Nurse Practitioner

## 2023-09-20 ENCOUNTER — Other Ambulatory Visit (INDEPENDENT_AMBULATORY_CARE_PROVIDER_SITE_OTHER): Payer: No Typology Code available for payment source

## 2023-09-20 DIAGNOSIS — I10 Essential (primary) hypertension: Secondary | ICD-10-CM

## 2023-09-20 DIAGNOSIS — E1169 Type 2 diabetes mellitus with other specified complication: Secondary | ICD-10-CM

## 2023-09-21 LAB — LIPID PANEL
Cholesterol: 144 mg/dL (ref <200)
HDL: 49 mg/dL (ref >=40)
LDL Cholesterol (Calc): 83 mg/dL
Non-HDL Cholesterol (Calc): 95 mg/dL (ref <130)
Total CHOL/HDL Ratio: 2.9 (calc) (ref <5.0)
Triglycerides: 42 mg/dL (ref <150)

## 2023-09-21 LAB — COMPREHENSIVE METABOLIC PANEL
AG Ratio: 1.5 (calc) (ref 1.0–2.5)
ALT: 16 U/L (ref 9–46)
AST: 30 U/L (ref 10–40)
Albumin: 4.3 g/dL (ref 3.6–5.1)
Alkaline phosphatase (APISO): 58 U/L (ref 36–130)
BUN: 19 mg/dL (ref 7–25)
CO2: 28 mmol/L (ref 20–32)
Calcium: 9.5 mg/dL (ref 8.6–10.3)
Chloride: 102 mmol/L (ref 98–110)
Creat: 1.18 mg/dL (ref 0.60–1.29)
Globulin: 2.8 g/dL (ref 1.9–3.7)
Glucose, Bld: 101 mg/dL — ABNORMAL HIGH (ref 65–99)
Potassium: 4.4 mmol/L (ref 3.5–5.3)
Sodium: 139 mmol/L (ref 135–146)
Total Bilirubin: 0.8 mg/dL (ref 0.2–1.2)
Total Protein: 7.1 g/dL (ref 6.1–8.1)

## 2023-09-21 LAB — CBC WITH DIFFERENTIAL/PLATELET
Absolute Lymphocytes: 2001 {cells}/uL (ref 850–3900)
Absolute Monocytes: 661 {cells}/uL (ref 200–950)
Basophils Absolute: 51 {cells}/uL (ref 0–200)
Basophils Relative: 0.9 %
Eosinophils Absolute: 222 {cells}/uL (ref 15–500)
Eosinophils Relative: 3.9 %
HCT: 39.8 % (ref 38.5–50.0)
Hemoglobin: 13 g/dL — ABNORMAL LOW (ref 13.2–17.1)
MCH: 29.4 pg (ref 27.0–33.0)
MCHC: 32.7 g/dL (ref 32.0–36.0)
MCV: 90 fL (ref 80.0–100.0)
MPV: 10.1 fL (ref 7.5–12.5)
Monocytes Relative: 11.6 %
Neutro Abs: 2765 {cells}/uL (ref 1500–7800)
Neutrophils Relative %: 48.5 %
Platelets: 226 10*3/uL (ref 140–400)
RBC: 4.42 10*6/uL (ref 4.20–5.80)
RDW: 12 % (ref 11.0–15.0)
Total Lymphocyte: 35.1 %
WBC: 5.7 10*3/uL (ref 3.8–10.8)

## 2023-09-21 LAB — HEMOGLOBIN A1C
Hgb A1c MFr Bld: 7.1 %{Hb} — ABNORMAL HIGH (ref <5.7)
Mean Plasma Glucose: 157 mg/dL
eAG (mmol/L): 8.7 mmol/L

## 2023-09-21 LAB — TSH: TSH: 0.5 m[IU]/L (ref 0.40–4.50)

## 2023-10-03 ENCOUNTER — Encounter: Payer: Self-pay | Admitting: Nurse Practitioner

## 2023-10-22 ENCOUNTER — Other Ambulatory Visit: Payer: Self-pay | Admitting: Nurse Practitioner

## 2023-11-07 ENCOUNTER — Encounter: Payer: No Typology Code available for payment source | Admitting: Nurse Practitioner

## 2023-11-19 LAB — HM DIABETES EYE EXAM

## 2023-12-03 ENCOUNTER — Encounter: Payer: Self-pay | Admitting: Nurse Practitioner

## 2023-12-12 ENCOUNTER — Encounter: Admitting: Nurse Practitioner

## 2023-12-22 ENCOUNTER — Other Ambulatory Visit: Payer: Self-pay | Admitting: Nurse Practitioner

## 2023-12-22 DIAGNOSIS — E1149 Type 2 diabetes mellitus with other diabetic neurological complication: Secondary | ICD-10-CM

## 2023-12-22 DIAGNOSIS — E1169 Type 2 diabetes mellitus with other specified complication: Secondary | ICD-10-CM

## 2023-12-22 DIAGNOSIS — E1165 Type 2 diabetes mellitus with hyperglycemia: Secondary | ICD-10-CM

## 2023-12-22 DIAGNOSIS — I10 Essential (primary) hypertension: Secondary | ICD-10-CM

## 2024-02-05 ENCOUNTER — Other Ambulatory Visit: Payer: Self-pay | Admitting: Nurse Practitioner

## 2024-02-09 ENCOUNTER — Other Ambulatory Visit: Payer: Self-pay | Admitting: Nurse Practitioner

## 2024-02-10 NOTE — Telephone Encounter (Signed)
 Will hold until OV with provider on 02/14/24.

## 2024-02-14 ENCOUNTER — Encounter: Payer: Self-pay | Admitting: Nurse Practitioner

## 2024-02-14 ENCOUNTER — Ambulatory Visit: Admitting: Nurse Practitioner

## 2024-02-14 ENCOUNTER — Ambulatory Visit

## 2024-02-14 ENCOUNTER — Telehealth: Payer: Self-pay | Admitting: Nurse Practitioner

## 2024-02-14 VITALS — BP 116/76 | HR 67 | Temp 98.0°F | Ht 72.0 in | Wt 232.8 lb

## 2024-02-14 DIAGNOSIS — Z0000 Encounter for general adult medical examination without abnormal findings: Secondary | ICD-10-CM

## 2024-02-14 DIAGNOSIS — Z23 Encounter for immunization: Secondary | ICD-10-CM

## 2024-02-14 DIAGNOSIS — Z800 Family history of malignant neoplasm of digestive organs: Secondary | ICD-10-CM | POA: Diagnosis not present

## 2024-02-14 DIAGNOSIS — Z8042 Family history of malignant neoplasm of prostate: Secondary | ICD-10-CM | POA: Diagnosis not present

## 2024-02-14 DIAGNOSIS — Z1211 Encounter for screening for malignant neoplasm of colon: Secondary | ICD-10-CM

## 2024-02-14 LAB — CBC WITH DIFFERENTIAL/PLATELET
Basophils Absolute: 0.1 K/uL (ref 0.0–0.1)
Basophils Relative: 1.1 % (ref 0.0–3.0)
Eosinophils Absolute: 0.2 K/uL (ref 0.0–0.7)
Eosinophils Relative: 5.3 % — ABNORMAL HIGH (ref 0.0–5.0)
HCT: 39.8 % (ref 39.0–52.0)
Hemoglobin: 13.2 g/dL (ref 13.0–17.0)
Lymphocytes Relative: 38.5 % (ref 12.0–46.0)
Lymphs Abs: 1.8 K/uL (ref 0.7–4.0)
MCHC: 33.1 g/dL (ref 30.0–36.0)
MCV: 88.1 fl (ref 78.0–100.0)
Monocytes Absolute: 0.5 K/uL (ref 0.1–1.0)
Monocytes Relative: 10.5 % (ref 3.0–12.0)
Neutro Abs: 2.1 K/uL (ref 1.4–7.7)
Neutrophils Relative %: 44.6 % (ref 43.0–77.0)
Platelets: 203 K/uL (ref 150.0–400.0)
RBC: 4.52 Mil/uL (ref 4.22–5.81)
RDW: 12.6 % (ref 11.5–15.5)
WBC: 4.7 K/uL (ref 4.0–10.5)

## 2024-02-14 LAB — COMPREHENSIVE METABOLIC PANEL WITH GFR
ALT: 16 U/L (ref 0–53)
AST: 20 U/L (ref 0–37)
Albumin: 4.5 g/dL (ref 3.5–5.2)
Alkaline Phosphatase: 64 U/L (ref 39–117)
BUN: 24 mg/dL — ABNORMAL HIGH (ref 6–23)
CO2: 28 meq/L (ref 19–32)
Calcium: 9.4 mg/dL (ref 8.4–10.5)
Chloride: 103 meq/L (ref 96–112)
Creatinine, Ser: 1.22 mg/dL (ref 0.40–1.50)
GFR: 71.75 mL/min (ref >60.00)
Glucose, Bld: 85 mg/dL (ref 70–99)
Potassium: 4.4 meq/L (ref 3.5–5.1)
Sodium: 138 meq/L (ref 135–145)
Total Bilirubin: 0.8 mg/dL (ref 0.2–1.2)
Total Protein: 7.2 g/dL (ref 6.0–8.3)

## 2024-02-14 LAB — PSA: PSA: 0.99 ng/mL (ref 0.10–4.00)

## 2024-02-14 LAB — LIPID PANEL
Cholesterol: 157 mg/dL (ref 0–200)
HDL: 47 mg/dL (ref >39.00)
LDL Cholesterol: 102 mg/dL — ABNORMAL HIGH (ref 0–99)
NonHDL: 109.96
Total CHOL/HDL Ratio: 3
Triglycerides: 38 mg/dL (ref 0.0–149.0)
VLDL: 7.6 mg/dL (ref 0.0–40.0)

## 2024-02-14 LAB — MICROALBUMIN / CREATININE URINE RATIO
Creatinine,U: 143 mg/dL
Microalb Creat Ratio: 5.7 mg/g (ref 0.0–30.0)
Microalb, Ur: 0.8 mg/dL (ref 0.0–1.9)

## 2024-02-14 LAB — HEMOGLOBIN A1C: Hgb A1c MFr Bld: 7.1 % — ABNORMAL HIGH (ref 4.6–6.5)

## 2024-02-14 NOTE — Progress Notes (Signed)
 Established Patient Office Visit  Subjective:  Patient ID: Travis Scott, male    DOB: [birth year]  Age: 43 y.o. MRN: [id number]  CC:  Chief Complaint  Patient presents with   Annual Exam    Discuss Prostate Exam   Discussed the use of a AI scribe software for clinical note transcription with the patient, who gave verbal consent to proceed.  HPI  Patient presents to the clinic for annual physical exam. Diet: Eat mainly chicken, fish and ground [place]. Eating more  fruits and veggies. Occasionally fried food. Drinks water, tea, coffee and selsa water Exercise: walk daily, weight lifting  Vaccine Flu: no Tetanus: 2019 COVID:PfizerX3 Pneumonia: Due for 2nd dose.  Colonoscopy: Due PSA: Due  Family history:  Colon cancer: yes. GM  Prostate cancer:yes, Paternal uncle  Dentist: Every 6 months Ophthalmology: April, 2025, [facility] [place] HIV screening: complete 12/28/14  Hep C screening: Complete 11/23/20 Tobacco use: Cigar twice in 6 months Alcohol use: Socially  Illicit drugs: No   HPI   Past Medical History:  Diagnosis Date   Chronic back pain    Diabetes mellitus    Hypertension    Kidney stones     Past Surgical History:  Procedure Laterality Date   ear tubes with butterfly clip put in Right    for fluid drainage    Family History  Problem Relation Age of Onset   Hypertension Mother    Diabetes Mother    Diabetes Father    Hypertension Father    Gout Father    Healthy Sister    Cancer Maternal Grandmother        colon cancer   Breast cancer Maternal Grandmother    Diabetes Maternal Grandfather    Hypertension Maternal Grandfather    Cancer Paternal Grandmother        not sure of the type   Stroke Paternal Grandmother 58   Diabetes Paternal Grandfather    Cancer Paternal Uncle        Prostate cancer    Social History   Socioeconomic History   Marital status: Married    Spouse name: Sinquetta   Number of children: 3   Years  of education: masters degree   Highest education level: Not on file  Occupational History    Employer: [employer]  Tobacco Use   Smoking status: Some Days    Types: Cigars   Smokeless tobacco: Never   Tobacco comments:    maybe 2 times in 6 months period  Vaping Use   Vaping status: Never Used  Substance and Sexual Activity   Alcohol use: Not Currently    Comment: once every few months   Drug use: No   Sexual activity: Yes    Birth control/protection: None  Other Topics Concern   Not on file  Social History Narrative   11/30/19   From: [place] and came to [facility] [place]    Living: with Sinquetta (2006) and children   Work: Investment banker, 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      Family: 3 children Travis Scott (2008), Ila (2010), Ole (2015)       Enjoys: get outside, travel, race track, beach/mountains, listen to music, coloring books      Exercise: walking - 3 times a week 3-5 miles    Diet: trying to follow diabetic diet      Safety   Seat belts: Yes    Guns: Yes  and secure  Safe in relationships: Yes    Social Drivers of Corporate investment banker Strain: Not on file  Food Insecurity: Not on file  Transportation Needs: Not on file  Physical Activity: Not on file  Stress: Not on file  Social Connections: Not on file  Intimate Partner Violence: Not on file     Outpatient Medications Prior to Visit  Medication Sig Dispense Refill   amLODipine  (NORVASC ) 10 MG tablet TAKE 1 TABLET(10 MG) BY MOUTH DAILY 90 tablet 3   atorvastatin  (LIPITOR) 20 MG tablet TAKE 1 TABLET(20 MG) BY MOUTH DAILY 90 tablet 1   Blood Glucose Monitoring Suppl (ONETOUCH VERIO FLEX SYSTEM) w/Device KIT USE UP TO FOUR TIMES DAILY AS DIRECTED E11.9 1 kit 0   Continuous Blood Gluc Sensor (FREESTYLE LIBRE 3 SENSOR) MISC Apply sensor every 14 days. 2 each 3   gemfibrozil  (LOPID ) 600 MG tablet TAKE 1 TABLET(600 MG) BY MOUTH TWICE DAILY 180 tablet 0   glucose blood (ONETOUCH VERIO)  test strip Use twice a day as instructed 200 strip 3   glyBURIDE  (DIABETA ) 1.25 MG tablet TAKE 1 TABLET(1.25 MG) BY MOUTH EVERY MORNING 90 tablet 0   insulin  glargine, 2 Unit Dial, (TOUJEO  MAX SOLOSTAR) 300 UNIT/ML Solostar Pen Inject 100 Units into the skin at bedtime. 9 mL 3   Insulin  Pen Needle 32G X 4 MM MISC 1 each by Does not apply route daily. 100 each 3   Lancets (ONETOUCH DELICA PLUS LANCET33G) MISC Apply 1 each topically 2 (two) times daily. 200 each 5   losartan -hydrochlorothiazide (HYZAAR) 100-25 MG tablet TAKE 1 TABLET BY MOUTH DAILY 90 tablet 2   metFORMIN  (GLUCOPHAGE -XR) 750 MG 24 hr tablet Take 1 tablet (750 mg total) by mouth daily with breakfast. 90 tablet 1   metoprolol  succinate (TOPROL -XL) 100 MG 24 hr tablet TAKE 1 TABLET(100 MG) BY MOUTH DAILY WITH OR IMMEDIATELY FOLLOWING A MEAL 90 tablet 1   OZEMPIC , 0.25 OR 0.5 MG/DOSE, 2 MG/3ML SOPN INJECT 0.5 MG UNDER THE SKIN ONCE A WEEK 3 mL 0   promethazine -dextromethorphan (PROMETHAZINE -DM) 6.25-15 MG/5ML syrup Take 5 mLs by mouth 4 (four) times daily as needed for cough. 118 mL 0   Semaglutide , 1 MG/DOSE, 4 MG/3ML SOPN Inject 1 mg as directed once a week. 3 mL 2   benzonatate  (TESSALON ) 100 MG capsule Take 1 capsule (100 mg total) by mouth every 8 (eight) hours. 21 capsule 0   diclofenac (VOLTAREN) 50 MG EC tablet Take 50 mg by mouth 2 (two) times daily as needed.     No facility-administered medications prior to visit.    No Known Allergies  ROS Review of Systems Negative unless indicated in HPI.    Objective:    Physical Exam Constitutional:      Appearance: Normal appearance. He is normal weight.  HENT:     Head: Normocephalic.     Right Ear: Tympanic membrane normal.     Left Ear: Tympanic membrane normal.     Mouth/Throat:     Mouth: Mucous membranes are moist.  Eyes:     Extraocular Movements: Extraocular movements intact.     Conjunctiva/sclera: Conjunctivae normal.     Pupils: Pupils are equal, round, and  reactive to light.  Neck:     Thyroid: No thyroid mass or thyroid tenderness.  Cardiovascular:     Rate and Rhythm: Normal rate and regular rhythm.     Pulses: Normal pulses.     Heart sounds: Normal heart sounds. No murmur  heard. Pulmonary:     Effort: Pulmonary effort is normal.     Breath sounds: Normal breath sounds.  Abdominal:     General: Bowel sounds are normal.     Palpations: Abdomen is soft. There is no mass.     Tenderness: There is no abdominal tenderness. There is no rebound.  Musculoskeletal:        General: No swelling.     Cervical back: Neck supple. No tenderness.     Right lower leg: No edema.     Left lower leg: No edema.  Feet:     Right foot:     Skin integrity: Skin integrity normal.     Toenail Condition: Right toenails are normal.     Left foot:     Skin integrity: Callus present.     Toenail Condition: Left toenails are normal.  Skin:    Findings: No bruising, erythema or rash.  Neurological:     General: No focal deficit present.     Mental Status: He is alert and oriented to person, place, and time. Mental status is at baseline.  Psychiatric:        Mood and Affect: Mood normal.        Behavior: Behavior normal.        Thought Content: Thought content normal.        Judgment: Judgment normal.   Update on tetanus vaccine.  BP 116/76   Pulse 67   Temp 98 F (36.7 C)   Ht 6' (1.829 m)   Wt 232 lb 12.8 oz (105.6 kg)   SpO2 99%   BMI 31.57 kg/m  Wt Readings from Last 3 Encounters:  02/14/24 232 lb 12.8 oz (105.6 kg)  09/03/23 246 lb 9.6 oz (111.9 kg)  02/12/23 248 lb 12.8 oz (112.9 kg)     Health Maintenance  Topic Date Due   Diabetic kidney evaluation - Urine ACR  Never done   Hepatitis B Vaccines (1 of 3 - 19+ 3-dose series) Never done   HPV VACCINES (1 - 3-dose SCDM series) Never done   OPHTHALMOLOGY EXAM  10/27/2021   Colonoscopy  Never done   COVID-19 Vaccine (4 - 2024-25 season) 02/28/2024 (Originally 03/17/2023)   INFLUENZA  VACCINE  10/13/2024 (Originally 02/14/2024)   HEMOGLOBIN A1C  03/22/2024   Diabetic kidney evaluation - eGFR measurement  09/19/2024   FOOT EXAM  02/13/2025   DTaP/Tdap/Td (3 - Td or Tdap) 06/30/2028   Pneumococcal Vaccine: 19-49 Years  Completed   Hepatitis C Screening  Completed   HIV Screening  Completed   Meningococcal B Vaccine  Aged Out       Topic Date Due   Hepatitis B Vaccines (1 of 3 - 19+ 3-dose series) Never done   HPV VACCINES (1 - 3-dose SCDM series) Never done    Lab Results  Component Value Date   TSH 0.50 09/20/2023   Lab Results  Component Value Date   WBC 5.7 09/20/2023   HGB 13.0 (L) 09/20/2023   HCT 39.8 09/20/2023   MCV 90.0 09/20/2023   PLT 226 09/20/2023   Lab Results  Component Value Date   NA 139 09/20/2023   K 4.4 09/20/2023   CO2 28 09/20/2023   GLUCOSE 101 (H) 09/20/2023   BUN 19 09/20/2023   CREATININE 1.18 09/20/2023   BILITOT 0.8 09/20/2023   ALKPHOS 65 10/26/2022   AST 30 09/20/2023   ALT 16 09/20/2023   PROT 7.1 09/20/2023   ALBUMIN 4.4  10/26/2022   CALCIUM  9.5 09/20/2023   ANIONGAP 12 06/29/2013   EGFR 81 10/26/2022   GFR 75.00 01/26/2022   Lab Results  Component Value Date   CHOL 144 09/20/2023   Lab Results  Component Value Date   HDL 49 09/20/2023   Lab Results  Component Value Date   LDLCALC 83 09/20/2023   Lab Results  Component Value Date   TRIG 42 09/20/2023   Lab Results  Component Value Date   CHOLHDL 2.9 09/20/2023   Lab Results  Component Value Date   HGBA1C 7.1 (H) 09/20/2023      Assessment & Plan:  Annual physical exam Assessment & Plan: Uptodate on tetanus, Pneumonia provide in the office today.  Referral sent for colonoscopy. Family history of prostate cancer.  Will check PSA level. Normal Diabetic foot examination.  Blood work ordered as outlined. Encouraged patient to consume a balanced diet and regular exercise regimen. He has diabetic examination in April at [facility]  [place] will get records.       Orders: -     Lipid panel -     CBC with Differential/Platelet -     Comprehensive metabolic panel with GFR -     Hemoglobin A1c -     Microalbumin / creatinine urine ratio -     PSA; Future  Need for pneumococcal vaccine -     Pneumococcal conjugate vaccine 20-valent  Encounter for screening colonoscopy -     Ambulatory referral to Gastroenterology  Family history of colon cancer -     Ambulatory referral to Gastroenterology  Family history of prostate cancer -     PSA; Future    Follow-up: Return in about 4 months (around 06/15/2024) for chronic management.   Donnelle Olmeda, NP

## 2024-02-14 NOTE — Assessment & Plan Note (Addendum)
 Uptodate on tetanus, Pneumonia provide in the office today.  Referral sent for colonoscopy. Family history of prostate cancer.  Will check PSA level. Normal Diabetic foot examination.  Blood work ordered as outlined. Encouraged patient to consume a balanced diet and regular exercise regimen. He has diabetic examination in April at [facility] [place] will get records.

## 2024-02-14 NOTE — Patient Instructions (Signed)

## 2024-02-14 NOTE — Telephone Encounter (Signed)
 Eye Exam abstracted into the cart

## 2024-02-27 ENCOUNTER — Ambulatory Visit: Payer: Self-pay | Admitting: Nurse Practitioner

## 2024-04-03 ENCOUNTER — Other Ambulatory Visit: Payer: Self-pay | Admitting: Nurse Practitioner

## 2024-05-17 ENCOUNTER — Other Ambulatory Visit: Payer: Self-pay | Admitting: Nurse Practitioner

## 2024-06-17 ENCOUNTER — Other Ambulatory Visit: Payer: Self-pay

## 2024-06-18 ENCOUNTER — Ambulatory Visit: Admitting: Nurse Practitioner

## 2024-07-03 ENCOUNTER — Ambulatory Visit

## 2024-07-03 DIAGNOSIS — K5730 Diverticulosis of large intestine without perforation or abscess without bleeding: Secondary | ICD-10-CM | POA: Diagnosis not present

## 2024-07-03 DIAGNOSIS — D125 Benign neoplasm of sigmoid colon: Secondary | ICD-10-CM | POA: Diagnosis not present

## 2024-07-03 DIAGNOSIS — Z1211 Encounter for screening for malignant neoplasm of colon: Secondary | ICD-10-CM | POA: Diagnosis present

## 2024-07-06 ENCOUNTER — Telehealth: Payer: Self-pay

## 2024-07-06 ENCOUNTER — Other Ambulatory Visit (HOSPITAL_COMMUNITY): Payer: Self-pay

## 2024-07-06 NOTE — Telephone Encounter (Signed)
 Pharmacy Patient Advocate Encounter   Received notification from Onbase that prior authorization for Ozempic  4 is required/requested.   Insurance verification completed.   The patient is insured through [facility].   Per test claim: The current 28 day co-pay is, $178.17.  No PA needed at this time. This test claim was processed through [facility]- copay amounts may vary at other pharmacies due to pharmacy/plan contracts, or as the patient moves through the different stages of their insurance plan.    Current PA expires 01/12/26

## 2024-07-07 NOTE — Telephone Encounter (Signed)
 Sent Patient a my chart message to let him know that no PA needed at this time.

## 2024-07-14 ENCOUNTER — Other Ambulatory Visit (HOSPITAL_COMMUNITY): Payer: Self-pay

## 2024-07-16 ENCOUNTER — Other Ambulatory Visit: Payer: Self-pay | Admitting: Nurse Practitioner

## 2024-07-21 ENCOUNTER — Other Ambulatory Visit: Payer: Self-pay | Admitting: Nurse Practitioner

## 2024-08-14 ENCOUNTER — Ambulatory Visit: Admitting: Nurse Practitioner

## 2024-08-14 ENCOUNTER — Encounter: Payer: Self-pay | Admitting: Nurse Practitioner

## 2024-08-14 VITALS — BP 116/68 | HR 89 | Temp 97.9°F | Ht 72.0 in | Wt 236.0 lb

## 2024-08-14 DIAGNOSIS — E785 Hyperlipidemia, unspecified: Secondary | ICD-10-CM

## 2024-08-14 DIAGNOSIS — Z794 Long term (current) use of insulin: Secondary | ICD-10-CM

## 2024-08-14 DIAGNOSIS — E1169 Type 2 diabetes mellitus with other specified complication: Secondary | ICD-10-CM

## 2024-08-14 DIAGNOSIS — M79672 Pain in left foot: Secondary | ICD-10-CM

## 2024-08-14 LAB — URIC ACID: Uric Acid, Serum: 7.9 mg/dL — ABNORMAL HIGH (ref 4.0–7.8)

## 2024-08-14 LAB — SEDIMENTATION RATE: Sed Rate: 28 mm/h — ABNORMAL HIGH (ref 0–15)

## 2024-08-14 NOTE — Assessment & Plan Note (Signed)
 Orders:    Hemoglobin A1c; Future

## 2024-08-14 NOTE — Patient Instructions (Signed)
 2

## 2024-08-18 ENCOUNTER — Ambulatory Visit: Payer: Self-pay | Admitting: Nurse Practitioner

## 2024-08-26 ENCOUNTER — Other Ambulatory Visit

## 2024-08-28 ENCOUNTER — Ambulatory Visit: Admitting: Nurse Practitioner
